# Patient Record
Sex: Female | Born: 1970 | Race: Black or African American | Hispanic: No | Smoking: Former smoker
Health system: Southern US, Community
[De-identification: ages and names within clinical notes are randomized; demographics above are authoritative.]

## PROBLEM LIST (undated history)

## (undated) DIAGNOSIS — A6000 Herpesviral infection of urogenital system, unspecified: Secondary | ICD-10-CM

## (undated) DIAGNOSIS — D219 Benign neoplasm of connective and other soft tissue, unspecified: Secondary | ICD-10-CM

## (undated) DIAGNOSIS — K219 Gastro-esophageal reflux disease without esophagitis: Secondary | ICD-10-CM

## (undated) DIAGNOSIS — E039 Hypothyroidism, unspecified: Secondary | ICD-10-CM

## (undated) DIAGNOSIS — D649 Anemia, unspecified: Secondary | ICD-10-CM

## (undated) DIAGNOSIS — N920 Excessive and frequent menstruation with regular cycle: Secondary | ICD-10-CM

## (undated) DIAGNOSIS — E669 Obesity, unspecified: Secondary | ICD-10-CM

## (undated) HISTORY — DX: Anemia, unspecified: D64.9

## (undated) HISTORY — DX: Obesity, unspecified: E66.9

## (undated) HISTORY — DX: Hypothyroidism, unspecified: E03.9

## (undated) HISTORY — DX: Benign neoplasm of connective and other soft tissue, unspecified: D21.9

## (undated) HISTORY — DX: Excessive and frequent menstruation with regular cycle: N92.0

## (undated) HISTORY — DX: Gastro-esophageal reflux disease without esophagitis: K21.9

## (undated) HISTORY — PX: ABDOMINAL HYSTERECTOMY: SHX81

## (undated) HISTORY — DX: Herpesviral infection of urogenital system, unspecified: A60.00

---

## 1994-10-25 HISTORY — PX: TUBAL LIGATION: SHX77

## 2007-08-14 ENCOUNTER — Emergency Department: Payer: Self-pay | Admitting: Emergency Medicine

## 2012-10-25 HISTORY — PX: BREAST BIOPSY: SHX20

## 2013-04-16 ENCOUNTER — Ambulatory Visit: Payer: Self-pay | Admitting: Obstetrics & Gynecology

## 2013-04-20 ENCOUNTER — Encounter: Payer: Self-pay | Admitting: *Deleted

## 2013-05-01 ENCOUNTER — Encounter: Payer: Self-pay | Admitting: General Surgery

## 2013-05-01 ENCOUNTER — Ambulatory Visit (INDEPENDENT_AMBULATORY_CARE_PROVIDER_SITE_OTHER): Payer: BC Managed Care – PPO | Admitting: General Surgery

## 2013-05-01 VITALS — BP 150/72 | HR 72 | Resp 14 | Ht 59.0 in | Wt 214.0 lb

## 2013-05-01 DIAGNOSIS — N63 Unspecified lump in unspecified breast: Secondary | ICD-10-CM

## 2013-05-01 NOTE — Patient Instructions (Addendum)
Continue self breast exams. Call office for any new breast issues or concerns.     CARE AFTER BREAST BIOPSY  1. Leave the dressing on that your doctor applied after surgery. It is waterproof. You may bathe, shower and/or swim. The dressing will probably remain intact until your return office visit. If the dressing comes off, you will see small strips of tape against your skin on the incision. Do not remove these strips.  2. You may want to use a gauze,cloth or similar protection in your bra to prevent rubbing against your dressing and incision. This is not necessary, but you may feel more comfortable doing so.  3. It is recommended that you wear a bra day and night to give support to the breast. This will prevent the weight of the breast from pulling on the incision.  4. Your breast will feel hard and lumpy under the incision. Do not be alarmed. This is the underlying stitching of tissue. Softening of this tissue will occur in time.  5. Make sure you call the office and schedule an appointment in one week after your surgery. The office phone number is [phone]. The nurses at [facility] may have already done this for you.  6. You will notice about a week after your office visit that the strips of the tape on your incision will begin to loosen. These may then be removed.  7. Report to your doctor any of the following:  * Severe pain not relieved by your pain medication  *Redness of the incision  * Drainage from the incision  *Fever greater than 101 degrees  If biopsy results normal then follow up in 6 months with left mammogram and office visit

## 2013-05-01 NOTE — Progress Notes (Signed)
aPatient ID: Sue Chan, female   DOB: [birth year], 42 y.o.   MRN: [id number]  Chief Complaint  Patient presents with  . Other    mammogram    HPI Sue Chan is a 42 y.o. female.  who presents for a breast evaluation. The most recent mammogram was done on 04-02-13. This was actually her first mammogram. Patient does perform regular self breast checks. Family history of breast cancer includes a paternal cousin no immediate family members. Denies any breast trauma and states that she "can't feel anything".   No breast issues only" little soreness" since mammogram.  HPI  Past Medical History  Diagnosis Date  . GERD (gastroesophageal reflux disease)     Past Surgical History  Procedure Laterality Date  . Tubal ligation  1996  . Cesarean section  1994    Family History  Problem Relation Age of Onset  . Breast cancer Cousin 51    paternal  . Colon polyps Mother 87    Social History History  Substance Use Topics  . Smoking status: Current Every Day Smoker -- 1.00 packs/day for 5 years  . Smokeless tobacco: Never Used     Comment: trying to cut down on the smoking  . Alcohol Use: Yes    No Known Allergies  Current Outpatient Prescriptions  Medication Sig Dispense Refill  . ibuprofen (ADVIL,MOTRIN) 100 MG tablet Take 100 mg by mouth every 6 (six) hours as needed for fever.      . Iron-Vit C-Vit B12-Folic Acid (IRON 100 PLUS PO) Take by mouth.      Marland Kitchen LORATADINE ALLERGY RELIEF PO Take 10 mg by mouth daily.      . metroNIDAZOLE (FLAGYL) 250 MG tablet Take 250 mg by mouth 3 (three) times daily.      . vitamin B-12 (CYANOCOBALAMIN) 100 MCG tablet Take 50 mcg by mouth daily.       No current facility-administered medications for this visit.    Review of Systems Review of Systems  Blood pressure 150/72, pulse 72, resp. rate 14, height 4\' 11"  (1.499 m), weight 214 lb (97.07 kg), last menstrual period 04/28/2013.  Physical Exam Physical Exam  Constitutional: She is  oriented to person, place, and time. She appears well-developed and well-nourished.  Cardiovascular: Normal rate and regular rhythm.   Pulmonary/Chest: Effort normal and breath sounds normal. Right breast exhibits no inverted nipple, no mass, no nipple discharge, no skin change and no tenderness. Left breast exhibits no inverted nipple, no mass, no nipple discharge, no skin change and no tenderness.  Lymphadenopathy:    She has no cervical adenopathy.    She has no axillary adenopathy.  Neurological: She is alert and oriented to person, place, and time.  Skin: Skin is warm and dry.   Left breast > right breast 1/2 cup size Data Reviewed The patient had previously undergone her first screening mammogram in June 2014 at [facility]. This showed an abnormality in the left breast for which additional views and ultrasound were completed at [facility]. On April 16, 2013. Total spot compression views showed a persistent macro lobulated mass in the left breast. Ultrasound in the 8:00 position 4 cm from the nipple she is 0.7 x 1.0 x 1.3 cm macro lobulated hypoechoic mass with peripheral Doppler flow.Indeterminate lesion. BI-RAD-4.   Ultrasound examination confirmed the [facility] findings. The options for management for this first-ever lesion were reviewed: 1) early vacuum-assisted biopsy versus 2) observation with 6 month followup ultrasound. The patient desired early biopsy. Assessment  Macrolobulated lesion of left breast.     Plan    Biopsy was completed using 10 cc of 0.5% Xylocaine with 0.25% Marcaine with 10-1998 units of epinephrine. This was well tolerated. The area was prepped with Betadine solution and draped. Using a 10-gauge Encor device the area was completely removed. A postbiopsy clip was placed. Scant bleeding was noted. The skin defect was closed with benzoin and Steri-Strips followed by Telfa and Tegaderm dressing. Written instructions were provided. The patient will follow up with the CMA.  In 6 days for wound evaluation. If pathology is benign as expected a repeat left mammogram with office visit follow be scheduled in 6 months. No contraindication to IUD placement for birth control.       Sue Chan 05/01/2013, 8:36 PM

## 2013-05-03 ENCOUNTER — Telehealth: Payer: Self-pay | Admitting: *Deleted

## 2013-05-03 LAB — PATHOLOGY

## 2013-05-03 NOTE — Telephone Encounter (Signed)
Notified patient as instructed, left breast biopsy benign, no cancer per Dr Lemar Livings, patient pleased. Discussed follow-up appointments, patient agrees

## 2013-05-07 ENCOUNTER — Ambulatory Visit (INDEPENDENT_AMBULATORY_CARE_PROVIDER_SITE_OTHER): Payer: BC Managed Care – PPO | Admitting: *Deleted

## 2013-05-07 DIAGNOSIS — N63 Unspecified lump in unspecified breast: Secondary | ICD-10-CM

## 2013-05-07 NOTE — Progress Notes (Signed)
Patient here today for follow up post left  breast biopsy. No bruising noted.  The patient is aware that a heating pad may be used for comfort as needed.  Aware of pathology. Follow up as scheduled. 

## 2013-05-18 ENCOUNTER — Encounter: Payer: Self-pay | Admitting: General Surgery

## 2013-10-25 DIAGNOSIS — A6000 Herpesviral infection of urogenital system, unspecified: Secondary | ICD-10-CM | POA: Insufficient documentation

## 2013-10-25 HISTORY — DX: Herpesviral infection of urogenital system, unspecified: A60.00

## 2013-10-25 HISTORY — PX: NOVASURE ABLATION: SHX5394

## 2013-11-23 ENCOUNTER — Ambulatory Visit: Payer: Self-pay | Admitting: General Surgery

## 2013-11-26 ENCOUNTER — Encounter: Payer: Self-pay | Admitting: General Surgery

## 2013-11-27 ENCOUNTER — Ambulatory Visit: Payer: BC Managed Care – PPO | Admitting: General Surgery

## 2013-11-27 DIAGNOSIS — N393 Stress incontinence (female) (male): Secondary | ICD-10-CM | POA: Insufficient documentation

## 2013-12-06 ENCOUNTER — Encounter: Payer: Self-pay | Admitting: *Deleted

## 2014-01-31 DIAGNOSIS — E0590 Thyrotoxicosis, unspecified without thyrotoxic crisis or storm: Secondary | ICD-10-CM | POA: Insufficient documentation

## 2014-05-17 ENCOUNTER — Ambulatory Visit: Payer: Self-pay | Admitting: Obstetrics and Gynecology

## 2014-05-17 ENCOUNTER — Encounter: Payer: Self-pay | Admitting: General Surgery

## 2014-08-14 DIAGNOSIS — J040 Acute laryngitis: Secondary | ICD-10-CM | POA: Insufficient documentation

## 2014-08-26 ENCOUNTER — Encounter: Payer: Self-pay | Admitting: General Surgery

## 2014-12-24 HISTORY — PX: THYROIDECTOMY: SHX17

## 2016-11-19 ENCOUNTER — Encounter: Payer: Self-pay | Admitting: Emergency Medicine

## 2016-11-19 ENCOUNTER — Ambulatory Visit
Admission: EM | Admit: 2016-11-19 | Discharge: 2016-11-19 | Disposition: A | Discharge status: Home / Self Care | Payer: BLUE CROSS/BLUE SHIELD | Attending: Family Medicine | Admitting: Family Medicine

## 2016-11-19 DIAGNOSIS — H6691 Otitis media, unspecified, right ear: Secondary | ICD-10-CM | POA: Diagnosis not present

## 2016-11-19 DIAGNOSIS — J40 Bronchitis, not specified as acute or chronic: Secondary | ICD-10-CM | POA: Diagnosis not present

## 2016-11-19 MED ORDER — BENZONATATE 100 MG PO CAPS: 100.0000 mg | ORAL_CAPSULE | Freq: Three times a day (TID) | ORAL | 0 refills | Status: DC | PRN

## 2016-11-19 MED ORDER — HYDROCOD POLST-CPM POLST ER 10-8 MG/5ML PO SUER: 5.0000 mL | Freq: Every evening | ORAL | 0 refills | Status: DC | PRN

## 2016-11-19 MED ORDER — AZITHROMYCIN 250 MG PO TABS: ORAL_TABLET | ORAL | 0 refills | Status: DC

## 2016-11-19 NOTE — ED Triage Notes (Signed)
Patient c/o cough and chest congestion that got worse on Monday.  Patient states that she previously had the flu. Patient denies fevers.

## 2016-11-19 NOTE — ED Provider Notes (Signed)
[name] ____________________________________________  Time seen: Approximately 1700 PM  I have reviewed the triage vital signs and the nursing notes.   HISTORY  Chief Complaint Cough   HPI Sue Chan is a 46 y.o. female presenting for the complaints of 1.5 weeks of cough, congestion. Patient reports symptoms initially started out with flulike symptoms. Patient reports that she was not seen last week for influenza, but reports her symptoms started with cough, congestion, chills, body aches and fever shortly after being exposed from her granddaughter he tested positive for influenza. Patient states flulike symptoms resolved but cough and nasal congestion has continued. Also reports gradual onset of right ear discomfort for the last few days. Patient denies any fever since last week. Reports cough is intermittently productive. Denies wheezing. Reports overall continues to eat and drink well. Patient reports symptoms have been unresolved over-the-counter cough and congestion medications. Reports has continued to work.  Denies chest pain, chest pain with deep breath, shortness of breath, abdominal pain, dysuria, extremity pain, extremity swelling or rash. Denies recent immobilization. Denies recent antibiotic use.   [place] #2: PCP Patient's last menstrual period was 11/12/2016 (approximate). Denies pregnancy   Past Medical History:  Diagnosis Date  . GERD (gastroesophageal reflux disease)     Patient Active Problem List   Diagnosis Date Noted  . Breast mass 05/01/2013    Past Surgical History:  Procedure Laterality Date  . CESAREAN SECTION  1994  . THYROIDECTOMY    . TUBAL LIGATION  1996     No current facility-administered medications for this encounter.   Current Outpatient Prescriptions:  .  levothyroxine (SYNTHROID, LEVOTHROID) 112 MCG tablet, Take 112 mcg by mouth daily before breakfast., Disp: , Rfl:  .  azithromycin  (ZITHROMAX Z-PAK) 250 MG tablet, Take 2 tablets (500 mg) on  Day 1,  followed by 1 tablet (250 mg) once daily on Days 2 through 5., Disp: 6 each, Rfl: 0 .  benzonatate (TESSALON PERLES) 100 MG capsule, Take 1 capsule (100 mg total) by mouth 3 (three) times daily as needed for cough., Disp: 15 capsule, Rfl: 0 .  chlorpheniramine-HYDROcodone (TUSSIONEX PENNKINETIC ER) 10-8 MG/5ML SUER, Take 5 mLs by mouth at bedtime as needed for cough. do not drive or operate machinery while taking as can cause drowsiness., Disp: 75 mL, Rfl: 0 .  ibuprofen (ADVIL,MOTRIN) 100 MG tablet, Take 100 mg by mouth every 6 (six) hours as needed for fever., Disp: , Rfl:  .  Iron-Vit C-Vit B12-Folic Acid (IRON 123XX123 PLUS PO), Take by mouth., Disp: , Rfl:  .  LORATADINE ALLERGY RELIEF PO, Take 10 mg by mouth daily., Disp: , Rfl:  .  vitamin B-12 (CYANOCOBALAMIN) 100 MCG tablet, Take 50 mcg by mouth daily., Disp: , Rfl:   Allergies Patient has no known allergies.  Family History  Problem Relation Age of Onset  . Breast cancer Cousin 60    paternal  . Colon polyps Mother 21    Social History Social History  Substance Use Topics  . Smoking status: Former Smoker    Packs/day: 1.00    Years: 5.00  . Smokeless tobacco: Never Used     Comment: trying to cut down on the smoking  . Alcohol use Yes    Review of Systems Constitutional:  As above. Eyes: No visual changes. ENT: No sore throat. Cardiovascular: Denies chest pain. Respiratory: Denies shortness of breath. Gastrointestinal: No abdominal pain.  No nausea, no vomiting.  No diarrhea.  No  constipation. Genitourinary: Negative for dysuria. Musculoskeletal: Negative for back pain. Skin: Negative for rash. Neurological: Negative for headaches, focal weakness or numbness.  10-point ROS otherwise negative.  ____________________________________________   PHYSICAL EXAM:  VITAL SIGNS: ED Triage Vitals  Enc Vitals Group     BP 11/19/16 1613 123/69     Pulse  Rate 11/19/16 1613 87     Resp 11/19/16 1613 16     Temp 11/19/16 1613 98.4 F (36.9 C)     Temp Source 11/19/16 1613 Oral     SpO2 11/19/16 1613 100 %     Weight 11/19/16 1612 208 lb (94.3 kg)     Height 11/19/16 1612 4\' 11"  (1.499 m)     Head Circumference --      Peak Flow --      Pain Score 11/19/16 1614 8     Pain Loc --      Pain Edu? --      Excl. in Early? --     Constitutional: Alert and oriented. Well appearing and in no acute distress. Eyes: Conjunctivae are normal. PERRL. EOMI. Head: Atraumatic. No sinus tenderness to palpation. No swelling. No erythema.  Ears: Left: Nontender, erythema, normal TM. Right: Nontender, moderate erythema and dullness at TM, TM appears intact. No surrounding tenderness, swelling or erythema bilaterally.  Nose:Nasal congestion with clear rhinorrhea  Mouth/Throat: Mucous membranes are moist. No pharyngeal erythema. No tonsillar swelling or exudate.  Neck: No stridor.  No cervical spine tenderness to palpation. Hematological/Lymphatic/Immunilogical: No cervical lymphadenopathy. Cardiovascular: Normal rate, regular rhythm. Grossly normal heart sounds.  Good peripheral circulation. Respiratory: Normal respiratory effort.  No retractions. No wheezes, rales or rhonchi. Good air movement. Dry intermittent cough in room. Speaks in complete sentences  Gastrointestinal: Soft and nontender. No CVA tenderness. Musculoskeletal: Ambulatory with steady gait. No cervical, thoracic or lumbar tenderness to palpation. Bilateral lower extremities nontender. Neurologic:  Normal speech and language. No gait instability. Skin:  Skin appears warm, dry and intact. No rash noted. Psychiatric: Mood and affect are normal. Speech and behavior are normal. ___________________________________________   LABS (all labs ordered are listed, but only abnormal results are displayed)  Labs Reviewed - No data to display   PROCEDURES Procedures   INITIAL IMPRESSION / ASSESSMENT  AND PLAN / ED COURSE  Pertinent labs & imaging results that were available during my care of the patient were reviewed by me and considered in my medical decision making (see chart for details).  Well-appearing patient. No acute distress. Patient recently per her report, with influenza. Patient reports current symptoms started with influenza and continued with cough and congestion. Suspect post influenza bronchitis. No focal area of consolidation auscultated. Discuss evaluation treatment options with patient. Will treat patient with oral azithromycin, when necessary Tussionex and when necessary Tessalon Perles. Encouraged rest, fluids and supportive care. Patient denies need for work note.Discussed indication, risks and benefits of medications with patient.  Discussed follow up with Primary care physician this week. Discussed follow up and return parameters including no resolution or any worsening concerns. Patient verbalized understanding and agreed to plan.   ____________________________________________   FINAL CLINICAL IMPRESSION(S) / ED DIAGNOSES  Final diagnoses:  Bronchitis  Right otitis media, unspecified otitis media type     Discharge Medication List as of 11/19/2016  5:07 PM    START taking these medications   Details  azithromycin (ZITHROMAX Z-PAK) 250 MG tablet Take 2 tablets (500 mg) on  Day 1,  followed by 1 tablet (250 mg) once  daily on Days 2 through 5., Normal    benzonatate (TESSALON PERLES) 100 MG capsule Take 1 capsule (100 mg total) by mouth 3 (three) times daily as needed for cough., Starting Fri 11/19/2016, Normal    chlorpheniramine-HYDROcodone (TUSSIONEX PENNKINETIC ER) 10-8 MG/5ML SUER Take 5 mLs by mouth at bedtime as needed for cough. do not drive or operate machinery while taking as can cause drowsiness., Starting Fri 11/19/2016, Print        Note: This dictation was prepared with Dragon dictation along with smaller phrase technology. Any transcriptional  errors that result from this process are unintentional.         Marylene Land, NP 11/19/16 [id number]

## 2016-11-19 NOTE — Discharge Instructions (Signed)
Take medication as prescribed. Rest. Drink plenty of fluids.  ° °Follow up with your primary care physician this week as needed. Return to Urgent care for new or worsening concerns.  ° °

## 2017-06-05 ENCOUNTER — Other Ambulatory Visit: Payer: Self-pay | Admitting: Certified Nurse Midwife

## 2017-07-25 ENCOUNTER — Other Ambulatory Visit: Payer: Self-pay | Admitting: Certified Nurse Midwife

## 2017-08-18 ENCOUNTER — Other Ambulatory Visit: Payer: Self-pay | Admitting: Obstetrics and Gynecology

## 2017-09-08 ENCOUNTER — Other Ambulatory Visit: Payer: Self-pay | Admitting: Certified Nurse Midwife

## 2017-12-12 ENCOUNTER — Telehealth: Payer: Self-pay

## 2017-12-12 MED ORDER — NORETHINDRONE 0.35 MG PO TABS: 1.0000 | ORAL_TABLET | Freq: Every day | ORAL | 0 refills | Status: DC

## 2017-12-12 NOTE — Telephone Encounter (Signed)
Pt calling for refill of bc, norlyda, annual sched 3/1st.  Left detailed msg that refilled eRx'd.

## 2017-12-23 ENCOUNTER — Ambulatory Visit: Payer: Self-pay | Admitting: Certified Nurse Midwife

## 2017-12-30 ENCOUNTER — Ambulatory Visit (INDEPENDENT_AMBULATORY_CARE_PROVIDER_SITE_OTHER): Payer: BLUE CROSS/BLUE SHIELD | Admitting: Certified Nurse Midwife

## 2017-12-30 ENCOUNTER — Encounter: Payer: Self-pay | Admitting: Certified Nurse Midwife

## 2017-12-30 VITALS — BP 114/82 | HR 89 | Ht 60.0 in | Wt 213.0 lb

## 2017-12-30 DIAGNOSIS — Z1231 Encounter for screening mammogram for malignant neoplasm of breast: Secondary | ICD-10-CM

## 2017-12-30 DIAGNOSIS — E669 Obesity, unspecified: Secondary | ICD-10-CM

## 2017-12-30 DIAGNOSIS — Z01419 Encounter for gynecological examination (general) (routine) without abnormal findings: Secondary | ICD-10-CM

## 2017-12-30 DIAGNOSIS — D219 Benign neoplasm of connective and other soft tissue, unspecified: Secondary | ICD-10-CM

## 2017-12-30 DIAGNOSIS — Z124 Encounter for screening for malignant neoplasm of cervix: Secondary | ICD-10-CM

## 2017-12-30 DIAGNOSIS — Z1239 Encounter for other screening for malignant neoplasm of breast: Secondary | ICD-10-CM

## 2017-12-30 HISTORY — DX: Obesity, unspecified: E66.9

## 2017-12-30 MED ORDER — NORETHIN-ETH ESTRAD-FE BIPHAS 1 MG-10 MCG / 10 MCG PO TABS: 1.0000 | ORAL_TABLET | Freq: Every day | ORAL | 0 refills | Status: DC

## 2017-12-30 NOTE — Progress Notes (Signed)
Gynecology Annual Exam  PCP: #2, [facility]  Chief Complaint:  Chief Complaint  Patient presents with  . Gynecologic Exam    Talk about changing birth control    History of Present Illness:Sue Chan presents today for her annual exam. She is a year old African American/Black female, G 2 P 2 0 0 2, whose LMP was 12/23/2017. Her menses are a little  Irregular on the progesterone only pills. They occur every month, they last 5 days, area moderate flow. She has not had any intermenstrual spotting. She is cramping with her menses, but it has gotten less since starting the progesterone only pill. She occasionally uses ibuprofen 800 mgm  For pain with relief. She has a history of menorrhagia and fibroids. She tried a Corporate treasurer then had a ablation when the Mirena failed. Pt reports her cycles have become lighter since her NovaSure procedure in 02/2014. OCPs then progesterone only pills were then added to help to help regulate her menstrual cycle. She is asking about switching back to combined oral contraceptive pills to make her menses more regular and to maybe take them to eliminate some cycles.  Since her last annual exam 10/16/2017she has gained 14#. She has restarted phenteramine and exercise to try to lose weight. 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  The patient's past medical history is remarkable for hypothyroidism following a thyroidectomy in March 2016 for multinodular goiter, and obesity. Pt sporadically takes diuretics for swelling and denies any history of hypertension. Is no longer taking metformin for weight loss. She is sexually active. She is currently using permanent sterilization for contraception. She has a history of HSV 2 antibodies but denies any history of an outbreak.  Her most recent pap smear was obtained 08/09/2016 and was NIL. Her most recent mammogram obtained 04/2014 and after additional views and an ultrasound  for focal asymmetry, no masses were seen and it was Birads 3. There is a remote family history of breast cancer in her paternal second cousin. Genetic testing has not been done. There is no family history of ovarian cancer. The patient sometimes does not do monthly self breast exams.  The patient does not smoke.  The patient does not drink alcohol.  The patient does not use illegal drugs.  The patient exercises regularly. Her current BMI is 41.60kg/m2  The patient does get adequate calcium in her diet.  She has her labs managed by her PCP- [facility] in [place]. 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     Review of Systems: Review of Systems  Constitutional: Positive for malaise/fatigue. Negative for chills, fever and weight loss.       Positive for weight gain  HENT: Negative for congestion, sinus pain and sore throat.   Eyes: Positive for redness. Negative for blurred vision and pain.  Respiratory: Negative for hemoptysis, shortness of breath and wheezing.   Cardiovascular: Negative for chest pain, palpitations and leg swelling.  Gastrointestinal: Negative for abdominal pain, blood in stool, diarrhea, heartburn, nausea and vomiting.  Genitourinary: Negative for dysuria, frequency, hematuria and urgency.  Musculoskeletal: Negative for back pain, joint pain and myalgias.  Skin: Negative for itching and rash.  Neurological: Negative for dizziness, tingling and headaches.  Endo/Heme/Allergies: Positive for environmental allergies (with sneezing and coughing). Negative for polydipsia. Does not bruise/bleed easily.       Positive for hirsutism   Psychiatric/Behavioral: Negative for depression. The patient  is not nervous/anxious and does not have insomnia.        Positive for stress    Past Medical History:  Past Medical History:  Diagnosis Date  . Anemia   . Fibroids   . GERD (gastroesophageal reflux disease)   . 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  Hypothyroidism   . Menorrhagia   . Obesity 12/30/2017   BMI41.60 kg/m2    Past Surgical History:  Past Surgical History:  Procedure Laterality Date  . CESAREAN SECTION  1994  . NOVASURE ABLATION  2015   performed in office  . THYROIDECTOMY  12/2014   multinodular goiter  . TUBAL LIGATION  1996    Family History:  Family History  Problem Relation Age of Onset  . Breast cancer Cousin 68       paternal second cousin  . Colon polyps Mother 16  . Hypertension Mother   . Thyroid disease Sister     Social History:  Social History   Socioeconomic History  . Marital status: Legally Separated    Spouse name: Not on file  . Number of children: 2  . Years of education: Not on file  . Highest education level: Not on file  Social Needs  . Financial resource strain: Not on file  . Food insecurity - worry: Not on file  . Food insecurity - inability: Not on file  . Transportation needs - medical: Not on file  . Transportation needs - non-medical: Not on file  Occupational History  . Occupation: Recruitment consultant  Tobacco Use  . Smoking status: Former Smoker    Packs/day: 1.00    Years: 5.00    Pack years: 5.00  . Smokeless tobacco: Never Used  Substance and Sexual Activity  . Alcohol use: Yes    Comment: occsional  . Drug use: No  . Sexual activity: Yes    Partners: Male    Birth control/protection: Pill  Other Topics Concern  . Not on file  Social History Narrative  . Not on file    Allergies:  No Known Allergies  Medications:  Current Outpatient Medications on File Prior to Visit  Medication Sig Dispense Refill  . fluconazole (DIFLUCAN) 150 MG tablet   2  . ibuprofen (ADVIL,MOTRIN) 100 MG tablet Take 100 mg by mouth every 6 (six) hours as needed for fever.    . levothyroxine (SYNTHROID, LEVOTHROID) 112 MCG tablet Take 112 mcg by mouth daily before breakfast.    . norethindrone (NORLYDA) 0.35 MG tablet Take 1 tablet (0.35 mg total) by mouth daily. 28  tablet 0  . azithromycin (ZITHROMAX Z-PAK) 250 MG tablet Take 2 tablets (500 mg) on  Day 1,  followed by 1 tablet (250 mg) once daily on Days 2 through 5. (Patient not taking: Reported on 12/30/2017) 6 each 0  . benzonatate (TESSALON PERLES) 100 MG capsule Take 1 capsule (100 mg total) by mouth 3 (three) times daily as needed for cough. (Patient not taking: Reported on 12/30/2017) 15 capsule 0  . chlorpheniramine-HYDROcodone (TUSSIONEX PENNKINETIC ER) 10-8 MG/5ML SUER Take 5 mLs by mouth at bedtime as needed for cough. do not drive or operate machinery while taking as can cause drowsiness. (Patient not taking: Reported on 12/30/2017) 75 mL 0  . Iron-Vit C-Vit B12-Folic Acid (IRON 616 PLUS PO) Take by mouth.    Marland Kitchen LORATADINE ALLERGY RELIEF PO Take 10 mg by mouth daily.    . vitamin B-12 (CYANOCOBALAMIN) 100 MCG  tablet Take 50 mcg by mouth daily.     No current facility-administered medications on file prior to visit.    Physical Exam Vitals: BP 114/82   Pulse 89   Ht 5' (1.524 m)   Wt 213 lb (96.6 kg)   LMP 12/23/2017 (Exact Date)   BMI 41.60 kg/m   General: AAF in  NAD HEENT: normocephalic, anicteric Neck:  no palpable nodules, no cervical lymphadenopathy  Pulmonary: No increased work of breathing, CTAB Cardiovascular: RRR, without murmur  Breast: Breast symmetrical, no tenderness, no palpable nodules or masses, no skin or nipple retraction present, no nipple discharge.  No axillary, infraclavicular or supraclavicular lymphadenopathy. Abdomen: Soft, non-tender, obese, non-distended.  Umbilicus without lesions.  No hepatomegaly or masses palpable. No evidence of hernia. Genitourinary:  External: Normal external female genitalia.  Normal urethral meatus, normal Bartholin's and Skene's glands.    Vagina: Normal vaginal mucosa, no evidence of prolapse.    Cervix: Very posterior, no bleeding, non-tender  Uterus: Anteflexed, 10 week size, irregular contour, mobile, and non-tender  Adnexa: No  adnexal masses, non-tender  Rectal: deferred  Lymphatic: no evidence of inguinal lymphadenopathy Extremities: no edema, erythema, or tenderness Neurologic: Grossly intact Psychiatric: mood appropriate, affect full     Assessment: 47 y.o. G2P2 annual gyn exam Fibroid uterus-size similar to last exam Hx of menorrhagia  Plan:   1) Breast cancer screening - recommend monthly self breast exam and annual screening mammograms. Mammogram was ordered today. Patient to schedule at [facility].  2) Discussed options of switching over to a OCP or an extended cycle pill vs continuing on the progesterone only pill. After further discussion, patient realized that she has been taking only 3 of the 4 weeks of pills in each pack, thinking that the last week was placebos. Explained the correct way to take the pills. Mutual decision to have patient try the Lo Loestrin. There are no contraindications to estrogen. Explained how to take these pills and also explained risks of thromboembolism as well as other more common side effects on the pill. Follow up in 2-3 months for blood pressure check.  3) Cervical cancer screening - Pap was done. ASCCP guidelines and rational discussed.    4) discussed calcium and vitamin D3 requirements and the benefits of exercise.  5) Routine healthcare maintenance including cholesterol and diabetes screening managed by PCP   Dalia Heading, CNM

## 2017-12-31 ENCOUNTER — Encounter: Payer: Self-pay | Admitting: Certified Nurse Midwife

## 2017-12-31 DIAGNOSIS — D219 Benign neoplasm of connective and other soft tissue, unspecified: Secondary | ICD-10-CM | POA: Insufficient documentation

## 2017-12-31 DIAGNOSIS — E039 Hypothyroidism, unspecified: Secondary | ICD-10-CM | POA: Insufficient documentation

## 2017-12-31 DIAGNOSIS — N920 Excessive and frequent menstruation with regular cycle: Secondary | ICD-10-CM | POA: Insufficient documentation

## 2017-12-31 DIAGNOSIS — D649 Anemia, unspecified: Secondary | ICD-10-CM | POA: Insufficient documentation

## 2017-12-31 DIAGNOSIS — K219 Gastro-esophageal reflux disease without esophagitis: Secondary | ICD-10-CM | POA: Insufficient documentation

## 2018-01-05 LAB — IGP, APTIMA HPV
HPV APTIMA: NEGATIVE
PAP Smear Comment: 0

## 2018-01-06 ENCOUNTER — Other Ambulatory Visit: Payer: Self-pay | Admitting: Certified Nurse Midwife

## 2018-01-06 ENCOUNTER — Telehealth: Payer: Self-pay

## 2018-01-06 MED ORDER — NORETHINDRONE 0.35 MG PO TABS: 1.0000 | ORAL_TABLET | Freq: Every day | ORAL | 5 refills | Status: DC

## 2018-01-06 NOTE — Telephone Encounter (Signed)
Please advise for new rx. I received prior authorization request yesterday but it appears that pt is requesting a generic alternative. Thank you!

## 2018-01-06 NOTE — Telephone Encounter (Signed)
Pt was rx'd Lo Loestrin FE, ins is asking for it to be approved and then it's going to cost more than she anticipated even c coupon.  Would like to forget about Lo Loestrin FE and have Norlitta called in instead b/c she was taking it wrong.  She will call us if she has any problems c the norlitta.  She uses [facility] in [place]. [phone]

## 2018-02-02 ENCOUNTER — Other Ambulatory Visit: Payer: Self-pay | Admitting: Certified Nurse Midwife

## 2018-02-02 DIAGNOSIS — R928 Other abnormal and inconclusive findings on diagnostic imaging of breast: Secondary | ICD-10-CM

## 2018-02-08 ENCOUNTER — Other Ambulatory Visit: Payer: Self-pay | Admitting: Certified Nurse Midwife

## 2018-02-08 ENCOUNTER — Telehealth: Payer: Self-pay

## 2018-02-08 DIAGNOSIS — R928 Other abnormal and inconclusive findings on diagnostic imaging of breast: Secondary | ICD-10-CM

## 2018-02-08 NOTE — Telephone Encounter (Signed)
Pt is very upset that it's taking so long to get an order for a mammogram sent to [facility].  She states it's been a month now and it took two weeks for the records to get there.  She wants a regular mammogram with a special view of the left.  "PLEASE have doctor sign off on the order"  States Norville has sent emails.  Would rather we call Norville instead of calling her.  Her # [phone]

## 2018-02-08 NOTE — Telephone Encounter (Signed)
Confirmed with Norville that they have the orders for bilateral diagnostic mammogram and bilateral ultrasound. Per [place] Poli they spoke to patient on 02/02/18 who said she would call back on Friday to schedule her mammogram. CLG placed these orders on 02/02/18. Left msg for pt with this information and to call if any questions. Pt can call norville directly to schedule.

## 2018-02-09 NOTE — Telephone Encounter (Signed)
Pt called again yesterday stating orders were not signed. CLG spoke to [place] yesterday to complete orders. Norville to contact pt to schedule appt.

## 2018-03-01 ENCOUNTER — Ambulatory Visit
Admission: EM | Admit: 2018-03-01 | Discharge: 2018-03-01 | Disposition: A | Discharge status: Home / Self Care | Payer: BLUE CROSS/BLUE SHIELD | Attending: Family Medicine | Admitting: Family Medicine

## 2018-03-01 DIAGNOSIS — B9789 Other viral agents as the cause of diseases classified elsewhere: Secondary | ICD-10-CM | POA: Diagnosis not present

## 2018-03-01 DIAGNOSIS — J988 Other specified respiratory disorders: Secondary | ICD-10-CM | POA: Diagnosis not present

## 2018-03-01 DIAGNOSIS — R509 Fever, unspecified: Secondary | ICD-10-CM

## 2018-03-01 DIAGNOSIS — J029 Acute pharyngitis, unspecified: Secondary | ICD-10-CM

## 2018-03-01 LAB — RAPID STREP SCREEN (MED CTR MEBANE ONLY): STREPTOCOCCUS, GROUP A SCREEN (DIRECT): NEGATIVE

## 2018-03-01 MED ORDER — AMOXICILLIN-POT CLAVULANATE 875-125 MG PO TABS: 1.0000 | ORAL_TABLET | Freq: Two times a day (BID) | ORAL | 0 refills | Status: DC

## 2018-03-01 NOTE — ED Triage Notes (Signed)
Pt said she has had a sore throat for a while and thought it was allergies until her fever started last night. Took Advil and Flonase. Runny nose and mucus.

## 2018-03-01 NOTE — ED Provider Notes (Signed)
[name]    CSN: [id number] Arrival date & time: 03/01/18  1717  History   Chief Complaint Chief Complaint  Patient presents with  . Sore Throat   HPI  47 year old female presents with respiratory symptoms.  Patient states that she has not been feeling well since the weekend.  She is been expensing severe allergies.  She is been taking over-the-counter allergy medication without tremendous improvement.  She states that over the weekend she developed sore throat and fever.  She has not had a true fever.  Her temperature was elevated in the 99's.  She reports associated congestion, cough and runny nose.  She continues to take over-the-counter allergy medication.  No reported sick contact.  No known exacerbating factors other than allergens.  No other associated symptoms.  No other complaints.  Past Medical History:  Diagnosis Date  . Anemia   . Fibroids   . GERD (gastroesophageal reflux disease)   . 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 Hypothyroidism    s/p thyroidectomy for multinodular goiter  . Menorrhagia   . Obesity 12/30/2017   BMI41.60 kg/m2    Patient Active Problem List   Diagnosis Date Noted  . Hypothyroidism   . Menorrhagia   . GERD (gastroesophageal reflux disease)   . Fibroids   . Anemia   . Obesity 12/30/2017  . Herpes genitalis 10/25/2013  . Breast mass 05/01/2013    Past Surgical History:  Procedure Laterality Date  . CESAREAN SECTION  1994  . NOVASURE ABLATION  2015   performed in office  . THYROIDECTOMY  12/2014   multinodular goiter  . TUBAL LIGATION  1996    OB History    Gravida  2   Para  2   Term  2   Preterm      AB      Living  2     SAB      TAB      Ectopic      Multiple      Live Births  2        Obstetric Comments  1st Menstrual Cycle:  13 1st Pregnancy:  20         Home Medications    Prior to Admission medications   Medication Sig Start Date End Date Taking? Authorizing  Provider  hydrochlorothiazide (MICROZIDE) 12.5 MG capsule Take 10 mg by mouth daily.   Yes [provider]  levothyroxine (SYNTHROID, LEVOTHROID) 112 MCG tablet Take 112 mcg by mouth daily before breakfast.   Yes [provider]  LORATADINE ALLERGY RELIEF PO Take 10 mg by mouth daily.   Yes [provider]  norethindrone (NORLYDA) 0.35 MG tablet Take 1 tablet (0.35 mg total) by mouth daily. 01/06/18  Yes Dalia Heading, CNM  vitamin B-12 (CYANOCOBALAMIN) 100 MCG tablet Take 50 mcg by mouth daily.   Yes [provider]  amoxicillin-clavulanate (AUGMENTIN) 875-125 MG tablet Take 1 tablet by mouth every 12 (twelve) hours. 03/01/18   Coral Spikes, DO  fluconazole (DIFLUCAN) 150 MG tablet  12/29/17   [provider]  ibuprofen (ADVIL,MOTRIN) 100 MG tablet Take 100 mg by mouth every 6 (six) hours as needed for fever.    [provider]  Iron-Vit C-Vit B12-Folic Acid (IRON 128 PLUS PO) Take by mouth.    [provider]    Family History Family History  Problem Relation Age of Onset  . Breast cancer Cousin 45  paternal second cousin  . Colon polyps Mother 18  . Hypertension Mother   . Thyroid disease Sister     Social History Social History   Tobacco Use  . Smoking status: Former Smoker    Packs/day: 1.00    Years: 5.00    Pack years: 5.00  . Smokeless tobacco: Never Used  Substance Use Topics  . Alcohol use: Yes    Comment: occsional  . Drug use: No     Allergies   Patient has no known allergies.   Review of Systems Review of Systems  Constitutional: Positive for fever.  HENT: Positive for congestion, rhinorrhea and sore throat.   Respiratory: Positive for cough.    Physical Exam Triage Vital Signs ED Triage Vitals  Enc Vitals Group     BP 03/01/18 1730 (!) 140/94     Pulse Rate 03/01/18 1730 89     Resp 03/01/18 1730 18     Temp 03/01/18 1730 98 F (36.7 C)     Temp Source 03/01/18 1730 Oral      SpO2 03/01/18 1730 100 %     Weight 03/01/18 1732 220 lb (99.8 kg)     Height --      Head Circumference --      Peak Flow --      Pain Score 03/01/18 1732 7     Pain Loc --      Pain Edu? --      Excl. in [place]? --    Updated Vital Signs BP (!) 140/94 (BP Location: Left Arm)   Pulse 89   Temp 98 F (36.7 C) (Oral)   Resp 18   Wt 220 lb (99.8 kg)   LMP 02/14/2018 (Exact Date)   SpO2 100%   BMI 42.97 kg/m   Physical Exam  Constitutional: She is oriented to person, place, and time. She appears well-developed and well-nourished. No distress.  HENT:  Head: Normocephalic and atraumatic.  Nose: Mucosal edema present.  Mouth/Throat: Oropharynx is clear and moist.  Eyes: Conjunctivae are normal.  Cardiovascular: Normal rate and regular rhythm.  Pulmonary/Chest: Effort normal and breath sounds normal.  Neurological: She is alert and oriented to person, place, and time.  Psychiatric: She has a normal mood and affect. Her behavior is normal.  Nursing note and vitals reviewed.  UC Treatments / Results  Labs (all labs ordered are listed, but only abnormal results are displayed) Labs Reviewed  RAPID STREP SCREEN ([facility] ONLY)  CULTURE, GROUP A STREP [facility])    EKG None  Radiology No results found.  Procedures Procedures (including critical care time)  Medications Ordered in UC Medications - No data to display  Initial Impression / Assessment and Plan / UC Course  I have reviewed the triage vital signs and the nursing notes.  Pertinent labs & imaging results that were available during my care of the patient were reviewed by me and considered in my medical decision making (see chart for details).    47 year old female presents with a respiratory illness.  Likely viral on top of ongoing allergies.  Wait-and-see prescription for Augmentin given.  Continue Flonase and over-the-counter antihistamines.  Final Clinical Impressions(s) / UC Diagnoses   Final diagnoses:  Viral  respiratory illness     Discharge Instructions     This is likely viral + allergies.  Continue doing what you're doing.  Antibiotic if you fail to improve or worsen.  Take care  Dr. Lacinda Axon   ED Prescriptions  Medication Sig Dispense Auth. Provider   amoxicillin-clavulanate (AUGMENTIN) 875-125 MG tablet Take 1 tablet by mouth every 12 (twelve) hours. 14 tablet Coral Spikes, DO     Controlled Substance Prescriptions [place] Controlled Substance Registry consulted? Not Applicable   Coral Spikes, DO 03/01/18 [id number]

## 2018-03-01 NOTE — Discharge Instructions (Signed)
This is likely viral + allergies.  Continue doing what you're doing.  Antibiotic if you fail to improve or worsen.  Take care  Dr. Lacinda Axon

## 2018-03-03 ENCOUNTER — Ambulatory Visit
Admission: RE | Admit: 2018-03-03 | Discharge: 2018-03-03 | Disposition: A | Discharge status: Home / Self Care | Payer: BLUE CROSS/BLUE SHIELD | Source: Ambulatory Visit | Attending: Certified Nurse Midwife | Admitting: Certified Nurse Midwife

## 2018-03-03 DIAGNOSIS — R928 Other abnormal and inconclusive findings on diagnostic imaging of breast: Secondary | ICD-10-CM

## 2018-03-04 LAB — CULTURE, GROUP A STREP (THRC)

## 2018-03-08 ENCOUNTER — Encounter: Payer: Self-pay | Admitting: Certified Nurse Midwife

## 2018-03-08 DIAGNOSIS — N6012 Diffuse cystic mastopathy of left breast: Secondary | ICD-10-CM

## 2018-03-08 DIAGNOSIS — N6011 Diffuse cystic mastopathy of right breast: Secondary | ICD-10-CM | POA: Insufficient documentation

## 2018-04-07 ENCOUNTER — Ambulatory Visit: Payer: BLUE CROSS/BLUE SHIELD | Admitting: Certified Nurse Midwife

## 2018-07-14 ENCOUNTER — Other Ambulatory Visit: Payer: Self-pay | Admitting: Certified Nurse Midwife

## 2018-07-19 ENCOUNTER — Other Ambulatory Visit: Payer: Self-pay | Admitting: Certified Nurse Midwife

## 2018-07-19 MED ORDER — NORETHINDRONE 0.35 MG PO TABS: 1.0000 | ORAL_TABLET | Freq: Every day | ORAL | 6 refills | Status: DC

## 2018-07-19 NOTE — Telephone Encounter (Signed)
Pt inquiring about denial of refill of OCP. There was an issue at one time about changing her OCP bc her insurance said it was going to be expensive & only a few months was sent in, but she decided to keep it the same. She didn't think she needed a f/u apt. EJ#[phone].

## 2018-07-19 NOTE — Telephone Encounter (Signed)
She does not need a follow up appointment since she stayed on the progesterone only pills. Refills sent to pharmacy to last until next annual appointment in March

## 2018-07-20 NOTE — Telephone Encounter (Signed)
Left detailed msg.

## 2019-01-12 ENCOUNTER — Other Ambulatory Visit: Payer: Self-pay | Admitting: Certified Nurse Midwife

## 2019-01-31 ENCOUNTER — Telehealth: Payer: Self-pay

## 2019-01-31 NOTE — Telephone Encounter (Signed)
Pt called triage today requesting a different OCP. Please advise

## 2019-02-01 NOTE — Telephone Encounter (Signed)
Please schedule patient for a telephone appointment with me. [employer]

## 2019-02-05 NOTE — Telephone Encounter (Signed)
Called patient to schedule telephone visit, she preferred not to schedule appt, she says she knows what she wants, oral bc, and would like to just have it sent in.  Pt did not specify name, just said bc pill.

## 2019-02-06 ENCOUNTER — Telehealth: Payer: Self-pay | Admitting: Certified Nurse Midwife

## 2019-02-06 MED ORDER — NORETHIN ACE-ETH ESTRAD-FE 1-20 MG-MCG(24) PO TABS: 1.0000 | ORAL_TABLET | Freq: Every day | ORAL | 5 refills | Status: DC

## 2019-02-06 NOTE — Telephone Encounter (Signed)
Patient called. See telephone message. Will switch to Loestrin 24 generic. Having irregular bleeding on the POP. No contraindications to OCPs.

## 2019-02-06 NOTE — Telephone Encounter (Signed)
Sue Chan had called and wanted to switch from a progesterone only pill to a OCP. Tried switching to Lo Loestrin last year because having irregular bleeding on the POP and it worked well, but was too expensive, so she resumed the progesterone only pill. Can try her on Loestrin 24 generic. Discussed possible side effects (breast tenderness, nausea) and risks of thromboembolism. Nonsmoker and no history of CHTN, DM, cancer. Reviewed how to take pills and encouraged to take BP once on them 2 months. FU at time of annual. Dalia Heading, CNM

## 2019-03-01 ENCOUNTER — Telehealth: Payer: Self-pay

## 2019-03-01 NOTE — Telephone Encounter (Signed)
Pt called triage saying she has been on her period since April 18th, passing clots. Please advise.

## 2019-03-02 NOTE — Telephone Encounter (Signed)
Spoke with patient who is completing her first pack of Junel 24. Has been spotting and bleeding intermittently and cramping and passing 50cent size clots. Was switched from POP due to irregular bleeding.  Hx of fibroids. Discussed testing to differentiate hormonal cause of bleeding from pathological causes of bleeding. Advised to stop pills and will see her in 2 weeks for ultrasound and possible endometrial biopsy/ follow up.

## 2019-04-10 ENCOUNTER — Other Ambulatory Visit: Payer: Self-pay | Admitting: Certified Nurse Midwife

## 2019-04-10 DIAGNOSIS — D219 Benign neoplasm of connective and other soft tissue, unspecified: Secondary | ICD-10-CM

## 2019-04-10 DIAGNOSIS — N939 Abnormal uterine and vaginal bleeding, unspecified: Secondary | ICD-10-CM

## 2019-04-11 ENCOUNTER — Ambulatory Visit: Payer: BLUE CROSS/BLUE SHIELD | Admitting: Certified Nurse Midwife

## 2019-04-11 ENCOUNTER — Ambulatory Visit (INDEPENDENT_AMBULATORY_CARE_PROVIDER_SITE_OTHER): Payer: BC Managed Care – PPO

## 2019-04-11 ENCOUNTER — Other Ambulatory Visit (HOSPITAL_COMMUNITY)
Admission: RE | Admit: 2019-04-11 | Discharge: 2019-04-11 | Disposition: A | Discharge status: Home / Self Care | Payer: BC Managed Care – PPO | Source: Ambulatory Visit | Attending: Certified Nurse Midwife | Admitting: Certified Nurse Midwife

## 2019-04-11 ENCOUNTER — Encounter: Payer: Self-pay | Admitting: Certified Nurse Midwife

## 2019-04-11 ENCOUNTER — Other Ambulatory Visit: Payer: Self-pay

## 2019-04-11 VITALS — BP 124/76 | HR 85 | Ht 60.0 in | Wt 235.0 lb

## 2019-04-11 DIAGNOSIS — N939 Abnormal uterine and vaginal bleeding, unspecified: Secondary | ICD-10-CM | POA: Diagnosis not present

## 2019-04-11 DIAGNOSIS — Z23 Encounter for immunization: Secondary | ICD-10-CM

## 2019-04-11 DIAGNOSIS — D252 Subserosal leiomyoma of uterus: Secondary | ICD-10-CM | POA: Diagnosis not present

## 2019-04-11 DIAGNOSIS — D250 Submucous leiomyoma of uterus: Secondary | ICD-10-CM | POA: Diagnosis not present

## 2019-04-11 DIAGNOSIS — D251 Intramural leiomyoma of uterus: Secondary | ICD-10-CM

## 2019-04-11 DIAGNOSIS — D219 Benign neoplasm of connective and other soft tissue, unspecified: Secondary | ICD-10-CM

## 2019-04-11 NOTE — Patient Instructions (Signed)
Hysterectomy Information    A hysterectomy is a surgery to remove your uterus. After surgery, you will no longer have periods. Also, you will no longer be able to get pregnant.  Reasons for this surgery  You may have this surgery if:  · You have bleeding in your vagina:  ? That is not normal.  ? That does not stop, or that keeps coming back.  · You have long-term (chronic) pain in your lower belly (pelvic area).  · The lining of your uterus grows outside of the uterus (endometriosis).  · The lining of your uterus grows in the muscle of the uterus (adenomyosis).  · Your uterus falls down into your vagina (prolapse).  · You have a growth in your uterus that causes problems (uterine fibroids).  · You have cells that could turn into cancer (precancerous cells).  · You have cancer of the uterus or cervix.  Types of hysterectomies  There are 3 types of hysterectomies. Depending on the type, the surgery will:  · Remove the top part of the uterus (supracervical).  · Remove the uterus and the cervix (total).  · Remove the uterus, cervix, and tissue that holds the uterus in place (radical).  Ways a hysterectomy can be done  This surgery may be done in one of these ways:  · A cut (incision) is made in the belly (abdomen). The uterus is taken out through the cut.  · A cut is made in the vagina. The uterus is taken out through the cut.  · Three or four cuts are made in the belly. A device with a camera is put through one of the cuts. The uterus is cut into pieces and taken out through the cuts or the vagina.  · Three or four cuts are made in the belly. A device with a camera is put through one of the cuts. The uterus is taken out through the vagina.  · Three or four cuts are made in the belly. A computer helps control the surgical tools. The uterus is cut into small pieces. The pieces are taken out through the cuts or through the vagina.  Talk with your doctor about which way is best for you.  Risks of hysterectomy  Generally,  this surgery is safe. However, problems can happen, including:  · Bleeding.  · Needing donated blood (transfusion).  · Blood clots.  · Infection.  · Damage to other structures or organs.  · Allergic reactions.  · Needing to switch to a different type of surgery.  What to expect after surgery  · You will be given pain medicine.  · You will need to stay in the hospital for 1-2 days.  · Follow your doctor's instructions about:  ? Exercising.  ? Driving.  ? What activities are safe for you.  · You will need to have someone with you at home for 3-5 days.  · You will need to see your doctor after 2-4 weeks.  · You may get hot flashes, have night sweats, and have trouble sleeping.  · You may need to have Pap tests if your surgery was related to cancer. Talk with your doctor about how often you need Pap tests.  Questions to ask your doctor  · Do I need this surgery? Do I have other treatment options?  · What are my options for this surgery?  · What needs to be removed?  · What are the risks?  · What are the benefits?  ·   How long will I need to stay in the hospital?  · How long will I need to recover?  · What symptoms can I expect after the procedure?  Summary  · A hysterectomy is a surgery to remove your uterus. After surgery, you will no longer have periods. Also, you will no longer be able to get pregnant.  · Talk with your doctor about which type of hysterectomy is best for you.  This information is not intended to replace advice given to you by your health care provider. Make sure you discuss any questions you have with your health care provider.  Document Released: 01/03/2012 Document Revised: 01/11/2017 Document Reviewed: 01/11/2017  Elsevier Interactive Patient Education © 2019 Elsevier Inc.    Uterine Fibroids    Uterine fibroids (leiomyomas) are noncancerous (benign) tumors that can develop in the uterus. Fibroids may also develop in the fallopian tubes, cervix, or tissues (ligaments) near the uterus.  You may have  one or many fibroids. Fibroids vary in size, weight, and where they grow in the uterus. Some can become quite large. Most fibroids do not require medical treatment.  What are the causes?  The cause of this condition is not known.  What increases the risk?  You are more likely to develop this condition if you:  · Are in your 30s or 40s and have not gone through menopause.  · Have a family history of this condition.  · Are of African-American descent.  · Had your first period at an early age (early menarche).  · Have not had any children (nulliparity).  · Are overweight or obese.  What are the signs or symptoms?  Many women do not have any symptoms. Symptoms of this condition may include:  · Heavy menstrual bleeding.  · Bleeding or spotting between periods.  · Pain and pressure in the pelvic area, between the hips.  · Bladder problems, such as needing to urinate urgently or more often than usual.  · Inability to have children (infertility).  · Failure to carry pregnancy to term (miscarriage).  How is this diagnosed?  This condition may be diagnosed based on:  · Your symptoms and medical history.  · A physical exam.  · A pelvic exam that includes feeling for any tumors.  · Imaging tests, such as ultrasound or MRI.  How is this treated?  Treatment for this condition may include:  · Seeing your health care provider for follow-up visits to monitor your fibroids for any changes.  · Taking NSAIDs such as ibuprofen, naproxen, or aspirin to reduce pain.  · Hormone medicines. These may be taken as a pill, given in an injection, or delivered by a T-shaped device that is inserted into the uterus (intrauterine device, IUD).  · Surgery to remove one of the following:  ? The fibroids (myomectomy). Your health care provider may recommend this if fibroids affect your fertility and you want to become pregnant.  ? The uterus (hysterectomy).  ? Blood supply to the fibroids (uterine artery embolization).  Follow these instructions at  home:  · Take over-the-counter and prescription medicines only as told by your health care provider.  · Ask your health care provider if you should take iron pills or eat more iron-rich foods, such as dark green, leafy vegetables. Heavy menstrual bleeding can cause low iron levels.  · If directed, apply heat to your back or abdomen to reduce pain. Use the heat source that your health care provider recommends, such as a moist   heat pack or a heating pad.  ? Place a towel between your skin and the heat source.  ? Leave the heat on for 20-30 minutes.  ? Remove the heat if your skin turns bright red. This is especially important if you are unable to feel pain, heat, or cold. You may have a greater risk of getting burned.  · Pay close attention to your menstrual cycle. Tell your health care provider about any changes, such as:  ? Increased blood flow that requires you to use more pads or tampons than usual.  ? A change in the number of days that your period lasts.  ? A change in symptoms that are associated with your period, such as back pain or cramps in your abdomen.  · Keep all follow-up visits as told by your health care provider. This is important, especially if your fibroids need to be monitored for any changes.  Contact a health care provider if you:  · Have pelvic pain, back pain, or cramps in your abdomen that do not get better with medicine or heat.  · Develop new bleeding between periods.  · Have increased bleeding during or between periods.  · Feel unusually tired or weak.  · Feel light-headed.  Get help right away if you:  · Faint.  · Have pelvic pain that suddenly gets worse.  · Have severe vaginal bleeding that soaks a tampon or pad in 30 minutes or less.  Summary  · Uterine fibroids are noncancerous (benign) tumors that can develop in the uterus.  · The exact cause of this condition is not known.  · Most fibroids do not require medical treatment unless they affect your ability to have children  (fertility).  · Contact a health care provider if you have pelvic pain, back pain, or cramps in your abdomen that do not get better with medicines.  · Make sure you know what symptoms should cause you to get help right away.  This information is not intended to replace advice given to you by your health care provider. Make sure you discuss any questions you have with your health care provider.  Document Released: 10/08/2000 Document Revised: 09/06/2017 Document Reviewed: 09/06/2017  Elsevier Interactive Patient Education © 2019 Elsevier Inc.

## 2019-04-11 NOTE — Progress Notes (Signed)
No new complaints. Accepts TDAP today. Given IM Right Deltoid. Patient tolerated well.

## 2019-04-15 NOTE — Progress Notes (Signed)
HPI: Sue Chan is a 48 year old BF G2 P2002 who has a history of menorrhagia and fibroids.  She tried a Mirena then had a ablation when the Mirena failed in 2015. Her cycles initially become lighter after her NovaSure procedure.  OCPs then progesterone only pills were then added to help to help regulate her menstrual cycle. Her cycles were irregular on the POP s and she wanted to switch back to combined oral contraceptive pills to make her menses more regular and to maybe take them to eliminate some cycles. ON her first pack of Junel 24 she reported irregular bleeding and cramping and passing 50 cent sized clots. She was scheduled for a pelvic ultrasound.  Ultrasound today:The uterus is anteverted and measures 13.5 x 8.6 x 10.4cm.Marland Kitchen Echo texture is heterogenous and enlarged with evidence of focal masses. Within the uterus are multiple suspected fibroids measuring: Fibroid 1:  5.1 x 4.3 x 3.1cm (RT/high, SS) Fibroid 2:  3.1 x 1.6 x 1.3cm (RT/high, SS vs pedunculated) Fibroid 3:  2.0 x 2.4 x 1.3cm (LT/low, SS) Fibroid 4:  7.0 x 7.4 x 5.4cm (LT/low, SM) Fibroid 5:  5.2 x 4.8 x 4.6cm (LT/high, IM)  The Endometrium measures 6.2 mm with trace amount of fluid within.  Right Ovary measures 4.6 x 3.4 x 2.9 cm.  Left Ovary measures 3.9 x 3.1 x 2.1 cm. Both ovaries are multi-cystic.  Survey of the adnexa demonstrates no adnexal masses. There is no free fluid in the cul de sac.  Previous ultrasound 08/27/2016: Three fibroids were noted: 45 mm x 32 mm, 31mm x 80mm, and 49mm x31 mm      PMHx: She  has a past medical history of Anemia, Fibroids, GERD (gastroesophageal reflux disease), Herpes genitalis (2015), Hypothyroidism, Menorrhagia, and Obesity (12/30/2017). Also,  has a past surgical history that includes Tubal ligation (1996); Cesarean section (1994); Thyroidectomy (12/2014); Novasure ablation (2015); and Breast biopsy (Left, 2014)., family history includes Breast cancer (age of onset: 58) in  her cousin; Colon polyps (age of onset: 44) in her mother; Hypertension in her mother; Thyroid disease in her sister.,  reports that she has quit smoking. She has a 5.00 pack-year smoking history. She has never used smokeless tobacco. She reports current alcohol use. She reports that she does not use drugs.  She has a current medication list which includes the following prescription(s): ibuprofen, levothyroxine, amoxicillin-clavulanate, fluconazole, hydrochlorothiazide, iron-vit c-vit b12-folic acid, loratadine, norethindrone acetate-ethinyl estrad-fe, and vitamin b-12. Also, has No Known Allergies.  ROS  Objective: BP 124/76 (BP Location: Right Arm, Patient Position: Sitting, Cuff Size: Large)   Pulse 85   Ht 5' (1.524 m)   Wt 235 lb (106.6 kg)   LMP 04/02/2019   BMI 45.90 kg/m   Physical examination: General: BF in NAD Neuro: alert and oriented Psyche: normal mood and affect   Assessment: AUB probably due to large submucous fibroid. Significant growth of fibroids since 08/2016 Hx of menorrhagia s/p Novasure ablation  P: Discussed POM with Dr Georgianne Fick He recommended surgical treatment with hysterectomy Recommended endometrial sampling to rule out hyperplasia-Explained reason for endometrial biopsy, the procedure itself, and risks of perforation or infection. Patient consents to endometrial biopsy. See procedure note below Dalia Heading, CNM  Addendum: TDAP given today to update immunizations   Endometrial Biopsy After discussion with the patient regarding her abnormal uterine bleeding I recommended that she proceed with an endometrial biopsy for further diagnosis. The risks, benefits, alternatives, and indications for an endometrial biopsy were  discussed with the patient in detail. She understood the risks including infection and  bleeding  Written consent was obtained. She is aware of risk of inadequate sampling due to prior Novasure or fibroids  PROCEDURE NOTE:  Cervix was  prepped with Betadeine x 2 then sprayed with Hurricaine anesthetic. A tenaculum was applied to the anterior cervical lip.   The uterus was sounded to a length of 6 cm with the Pipelle. A small amount of tissue  was obtained with minimal blood loss.  The patient tolerated the procedure well.  Will call with results and have patient follow up with Dr Georgianne Fick for a conference.   Dalia Heading, CNM

## 2019-04-18 ENCOUNTER — Telehealth: Payer: Self-pay

## 2019-04-18 NOTE — Telephone Encounter (Signed)
Pt calling; was seen on the 17th and told to call if not feeling good; is extra crampy and not feeling good; taking 800mg  ibuprofen and the pain is pushing thru.  Uses [facility] in [place].  [phone]  Called to let pt know CLG is on call but will forward msg to her.  CLG has just called pt to let her know of her bx results so pt talked to her about this.

## 2019-05-11 ENCOUNTER — Encounter: Payer: Self-pay | Admitting: Obstetrics and Gynecology

## 2019-05-11 ENCOUNTER — Ambulatory Visit (INDEPENDENT_AMBULATORY_CARE_PROVIDER_SITE_OTHER): Payer: BC Managed Care – PPO | Admitting: Obstetrics and Gynecology

## 2019-05-11 ENCOUNTER — Other Ambulatory Visit: Payer: Self-pay

## 2019-05-11 VITALS — BP 138/82 | HR 103 | Ht 60.0 in | Wt 225.0 lb

## 2019-05-11 DIAGNOSIS — D250 Submucous leiomyoma of uterus: Secondary | ICD-10-CM | POA: Diagnosis not present

## 2019-05-11 DIAGNOSIS — N939 Abnormal uterine and vaginal bleeding, unspecified: Secondary | ICD-10-CM

## 2019-05-11 DIAGNOSIS — D252 Subserosal leiomyoma of uterus: Secondary | ICD-10-CM

## 2019-05-11 DIAGNOSIS — D251 Intramural leiomyoma of uterus: Secondary | ICD-10-CM

## 2019-05-11 DIAGNOSIS — Z7189 Other specified counseling: Secondary | ICD-10-CM

## 2019-05-11 MED ORDER — LUPRON DEPOT (3-MONTH) 11.25 MG IM KIT: 11.2500 mg | PACK | INTRAMUSCULAR | 1 refills | Status: DC

## 2019-05-11 MED ORDER — NORETHINDRONE ACETATE 5 MG PO TABS: 5.0000 mg | ORAL_TABLET | Freq: Every day | ORAL | 3 refills | Status: DC

## 2019-05-11 NOTE — Progress Notes (Signed)
Obstetrics & Gynecology Office Visit   Chief Complaint:  Chief Complaint  Patient presents with  . discuss possible surgery    regular periods just lasts 10-12 days    History of Present Illness: The patient is a 48 year old G27P2002 with a longstanding history of heavy menstrual cycles, likely secondary to uterine fibroids.  She has attempted hormonal management, including IUD, as well as endometrial ablation.  She had a brief respite in symptoms following ablation before failing this modality and having a resumption in bleeding.  She has tried OCP post ablation in attempt to salvage to procedure without success.  She is interested in discussing hysterectomy for definitive management.    Currently she reports regular monthly menses, last up to two weeks in length.  These are described as heavy in flow with associated dysmenorrhea.  Recent attempt at endometrial biopsy was unsuccessful secondary to endometrial scarring from her prior ablation.  The endocervical cells obtained were noted to be normal.  Pap smear is up to date 3/8/201 NIL HPV negative.    04/11/2019 revealed multiple uterine fibroids with the largest appearing to have submucosal component in the lower uterine segment.   Review of Systems: review of systems unless otherwise noted in HPI  Past Medical History:  Past Medical History:  Diagnosis Date  . Anemia   . Fibroids   . GERD (gastroesophageal reflux disease)   . 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 Hypothyroidism    s/p thyroidectomy for multinodular goiter  . Menorrhagia   . Obesity 12/30/2017   BMI41.60 kg/m2    Past Surgical History:  Past Surgical History:  Procedure Laterality Date  . BREAST BIOPSY Left 2014   benign, clip placed  . CESAREAN SECTION  1994  . NOVASURE ABLATION  2015   performed in office  . THYROIDECTOMY  12/2014   multinodular goiter  . TUBAL LIGATION  1996    Gynecologic History: Patient's last menstrual period  was 04/26/2019 (exact date).  Obstetric History: [id number]  Family History:  Family History  Problem Relation Age of Onset  . Breast cancer Cousin 72       paternal second cousin  . Colon polyps Mother 63  . Hypertension Mother   . Thyroid disease Sister     Social History:  Social History   Socioeconomic History  . Marital status: Legally Separated    Spouse name: Not on file  . Number of children: 2  . Years of education: Not on file  . Highest education level: Not on file  Occupational History  . Occupation: Recruitment consultant  Social Needs  . Financial resource strain: Not on file  . Food insecurity    Worry: Not on file    Inability: Not on file  . Transportation needs    Medical: Not on file    Non-medical: Not on file  Tobacco Use  . Smoking status: Former Smoker    Packs/day: 1.00    Years: 5.00    Pack years: 5.00  . Smokeless tobacco: Never Used  Substance and Sexual Activity  . Alcohol use: Yes    Comment: occsional  . Drug use: No  . Sexual activity: Yes    Partners: Male    Birth control/protection: None  Lifestyle  . Physical activity    Days per week: 3 days    Minutes per session: 30 min  . Stress: Very much  Relationships  . Social  connections    Talks on phone: Not on file    Gets together: Not on file    Attends religious service: Not on file    Active member of club or organization: Not on file    Attends meetings of clubs or organizations: Not on file    Relationship status: Not on file  . Intimate partner violence    Fear of current or ex partner: Not on file    Emotionally abused: Not on file    Physically abused: Not on file    Forced sexual activity: Not on file  Other Topics Concern  . Not on file  Social History Narrative  . Not on file    Allergies:  No Known Allergies  Medications: Prior to Admission medications   Medication Sig Start Date End Date Taking? Authorizing Provider  hydrochlorothiazide  (MICROZIDE) 12.5 MG capsule Take 10 mg by mouth daily.   Yes [provider]  ibuprofen (ADVIL,MOTRIN) 100 MG tablet Take 100 mg by mouth every 6 (six) hours as needed for fever.   Yes [provider]  levothyroxine (SYNTHROID, LEVOTHROID) 112 MCG tablet Take 112 mcg by mouth daily before breakfast.   Yes [provider]  Phentermine HCl 37.5 MG TBDP Take by mouth.   Yes [provider]  amoxicillin-clavulanate (AUGMENTIN) 875-125 MG tablet Take 1 tablet by mouth every 12 (twelve) hours. Patient not taking: Reported on 05/11/2019 03/01/18   Coral Spikes, DO  fluconazole (DIFLUCAN) 150 MG tablet  12/29/17   [provider]  Iron-Vit C-Vit B12-Folic Acid (IRON 458 PLUS PO) Take by mouth.    [provider]  LORATADINE ALLERGY RELIEF PO Take 10 mg by mouth daily.    [provider]  Norethindrone Acetate-Ethinyl Estrad-FE (LOESTRIN 24 FE) 1-20 MG-MCG(24) tablet Take 1 tablet by mouth daily. Patient not taking: Reported on 04/11/2019 02/06/19   Dalia Heading, CNM  vitamin B-12 (CYANOCOBALAMIN) 100 MCG tablet Take 50 mcg by mouth daily.    [provider]    Physical Exam Vitals:  Vitals:   05/11/19 1412  BP: 138/82  Pulse: (!) 103   Patient's last menstrual period was 04/26/2019 (exact date).  General: NAD, well nourished, appears stated age 17: normocephalic, anicteric Pulmonary: No increased work of breathing Neurologic: Grossly intact Psychiatric: mood appropriate, affect full  No results found.  Assessment: 48 y.o. [id number] surgical counseling regarding uterine fibroids  Plan: Problem List Items Addressed This Visit    None    Visit Diagnoses    Encounter for surgical counseling    -  Primary   Intramural, submucous, and subserous leiomyoma of uterus       Abnormal uterine bleeding         1) Patient opts for definitive surgical management via hysterectomy. The risks of surgery were discussed in detail  with the patient including but not limited to: bleeding which may require transfusion or reoperation; infection which may require antibiotics; injury to bowel, bladder, ureters or other surrounding organs (With a literature reported rate of urinary tract injury of 1% quoted); need for additional procedures including laparotomy; thromboembolic phenomenon, incisional problems and other postoperative/anesthesia complications.  Patient was also advised that recovery procedure generally involves an overnight stay; and the  expected recovery time after a hysterectomy being in the range of 6-8 weeks.  Likelihood of success in alleviating the patient's symptoms was discussed.  While definitive in regards to issues with menstural bleeding, pelvic pain if present preoperatively may  continue and in fact worsen postoperatively.  She is aware that the procedure will render her unable to pursue childbearing in the future.   She was told that she will be contacted by our surgical scheduler regarding the time and date of her surgery; routine preoperative instructions of having nothing to eat or drink after midnight on the day prior to surgery and also coming to the hospital 1.5 hours prior to her time of surgery were also emphasized.  She was told she may be called for a preoperative appointment about a week prior to surgery and will be given further preoperative instructions at that visit.  Routine postoperative instructions will be reviewed with the patient and her family in detail after surgery. Printed patient education handouts about the procedure was given to the patient to review at home. - preop lupron vs uterine artery embolization discussed.  Based on the fact that after initial flare likely better menstrual coverage with lurpon decision to proceed with lupron - preop MRI at conclusion of lupron course to help aid in decision on surgical approach, robotic vs TLH - we spent significant time discussing the fact that the  lower uterine fibroid may make access to uterine arteries more difficult and necessitate conversion to an open procedure.    2) A total of 15 minutes were spent in face-to-face contact with the patient during this encounter with over half of that time devoted to counseling and coordination of care.    Malachy Mood, MD, [facility], [facility] 05/11/2019, 2:16 PM

## 2019-05-14 ENCOUNTER — Telehealth: Payer: Self-pay | Admitting: Certified Nurse Midwife

## 2019-05-14 NOTE — Telephone Encounter (Signed)
Called and left Voice message for patient to call back to confirm  Patient schedule [facility] on Friday, 08/10/19 at 10 Arrival is 9:45 am

## 2019-05-15 NOTE — Telephone Encounter (Signed)
Called and left voicemail for patient to call back to confirm.

## 2019-05-16 NOTE — Telephone Encounter (Signed)
Called and left voicemail for patient to call back to confirm

## 2019-06-01 ENCOUNTER — Telehealth: Payer: Self-pay

## 2019-06-01 NOTE — Telephone Encounter (Signed)
Will contact pt to schedule apt for administration when Lupron arrives.

## 2019-06-01 NOTE — Telephone Encounter (Signed)
Sue Chan w/[facility] called to report the Lupron was on back order and needed to reschedule the shipment to Tuesday 06/05/2019. Verified delivery address [address] [place], [place] [address].

## 2019-07-11 NOTE — Telephone Encounter (Signed)
Advise

## 2019-07-11 NOTE — Telephone Encounter (Signed)
I guess we'll need to wait till 9/30 once it is in stock

## 2019-07-11 NOTE — Telephone Encounter (Signed)
Pharmacy left message with after hours on call service to notify us that Lupron is out of stock. ETA 9/30. [phone].

## 2019-07-17 NOTE — Telephone Encounter (Signed)
Delay in order. Medication is out of stock. Requesting call back from AMS or his assistant. [phone]

## 2019-07-19 NOTE — Telephone Encounter (Signed)
AMS, Please call pharmacy number below regarding Lupron Depo.

## 2019-07-20 ENCOUNTER — Telehealth: Payer: Self-pay

## 2019-07-20 NOTE — Telephone Encounter (Signed)
[facility] calling to let AMS know that the Lupron shot is not in stock as of now and will not be back until 07/25/19 they are hoping. They would like a call back to confirm that this is okay by AMS to wait CB# [phone]

## 2019-07-24 NOTE — Telephone Encounter (Signed)
Please call pharmacy to discuss other option

## 2019-07-24 NOTE — Telephone Encounter (Signed)
Ok to wait till it comes back in stock

## 2019-07-24 NOTE — Telephone Encounter (Signed)
Elizabeth from [facility] calling to f/u on pt's rx.  [phone]

## 2019-07-25 NOTE — Telephone Encounter (Signed)
I called pharmacy, they actually received medication shipment this morning. Shipment will go out tomorrow 10/1.

## 2019-08-07 ENCOUNTER — Other Ambulatory Visit: Payer: Self-pay | Admitting: Obstetrics and Gynecology

## 2019-08-07 DIAGNOSIS — D250 Submucous leiomyoma of uterus: Secondary | ICD-10-CM

## 2019-08-07 DIAGNOSIS — D251 Intramural leiomyoma of uterus: Secondary | ICD-10-CM

## 2019-08-08 ENCOUNTER — Telehealth: Payer: Self-pay | Admitting: Obstetrics and Gynecology

## 2019-08-08 NOTE — Telephone Encounter (Signed)
---   Message ----- From: Alexandria Lodge Sent: 08/08/2019   1:15 PM EDT To: Coralee Pesa Paschal Subject: Cancel                                         Per Dr. Georgianne Fick, the patient just received Leupron the end of September, needs to be scheduled for a nurse visit to receive the injection, then the MRI needs to be rescheduled for 3 mos from that date. Please call Centralized Scheduling to cancel the MRI, then contact the patient to schedule the nurse visit, then call Centralized Scheduling again to schedule the MRI 3 mos from the nurse visit. Please let me know the date of the MRI. Thanks.

## 2019-08-08 NOTE — Telephone Encounter (Signed)
Called and left voicemail for patient to contact our office about her schedule MRI has been cancelled pending patient needing injection prior to being reschedule.

## 2019-08-09 NOTE — Telephone Encounter (Signed)
Patient is calling stating she isn't interested is getting the Lupron or the MRI any more. Please cancel order.

## 2019-08-10 ENCOUNTER — Ambulatory Visit: Admission: RE | Admit: 2019-08-10 | Payer: BC Managed Care – PPO | Source: Ambulatory Visit

## 2019-08-10 NOTE — Telephone Encounter (Signed)
Sue Chan from [facility] about a delay in order of rx.  [phone]  Lupron is backordered until the end of Nov.  Adv pt has changed her mind and does not want it.

## 2019-08-14 NOTE — Telephone Encounter (Signed)
Elizabeth w/[facility] calling regarding delay in order for rx. [phone]

## 2019-08-15 NOTE — Telephone Encounter (Signed)
Spoke w/[facility] to inform again that patient no longer wants the medication. Per [facility] rep, there is a note in the chart regarding this already and there is no longer an active order. She is uncertain why we continue to get calls. She will make another note.

## 2019-08-23 NOTE — Telephone Encounter (Signed)
[facility] called again. RX has been cancelled per pt

## 2019-10-04 ENCOUNTER — Other Ambulatory Visit: Payer: Self-pay | Admitting: Obstetrics and Gynecology

## 2019-10-04 DIAGNOSIS — Z1231 Encounter for screening mammogram for malignant neoplasm of breast: Secondary | ICD-10-CM

## 2020-05-30 ENCOUNTER — Other Ambulatory Visit: Payer: Self-pay | Admitting: Obstetrics and Gynecology

## 2020-05-30 DIAGNOSIS — Z1231 Encounter for screening mammogram for malignant neoplasm of breast: Secondary | ICD-10-CM

## 2020-07-18 ENCOUNTER — Ambulatory Visit
Admission: RE | Admit: 2020-07-18 | Discharge: 2020-07-18 | Disposition: A | Discharge status: Home / Self Care | Payer: BC Managed Care – PPO | Source: Ambulatory Visit | Attending: Obstetrics and Gynecology | Admitting: Obstetrics and Gynecology

## 2020-07-18 ENCOUNTER — Other Ambulatory Visit: Payer: Self-pay

## 2020-07-18 DIAGNOSIS — Z1231 Encounter for screening mammogram for malignant neoplasm of breast: Secondary | ICD-10-CM | POA: Insufficient documentation

## 2020-10-22 ENCOUNTER — Encounter: Payer: Self-pay | Admitting: Emergency Medicine

## 2020-10-22 ENCOUNTER — Inpatient Hospital Stay
Admission: EM | Admit: 2020-10-22 | Discharge: 2020-10-26 | DRG: 177 | Disposition: A | Discharge status: Home / Self Care | Payer: BC Managed Care – PPO | Attending: Internal Medicine | Admitting: Internal Medicine

## 2020-10-22 ENCOUNTER — Other Ambulatory Visit: Payer: Self-pay

## 2020-10-22 DIAGNOSIS — Z803 Family history of malignant neoplasm of breast: Secondary | ICD-10-CM

## 2020-10-22 DIAGNOSIS — Z87891 Personal history of nicotine dependence: Secondary | ICD-10-CM

## 2020-10-22 DIAGNOSIS — Z8371 Family history of colonic polyps: Secondary | ICD-10-CM

## 2020-10-22 DIAGNOSIS — Z6841 Body Mass Index (BMI) 40.0 and over, adult: Secondary | ICD-10-CM

## 2020-10-22 DIAGNOSIS — Z8349 Family history of other endocrine, nutritional and metabolic diseases: Secondary | ICD-10-CM

## 2020-10-22 DIAGNOSIS — Z8249 Family history of ischemic heart disease and other diseases of the circulatory system: Secondary | ICD-10-CM

## 2020-10-22 DIAGNOSIS — R7989 Other specified abnormal findings of blood chemistry: Secondary | ICD-10-CM | POA: Diagnosis present

## 2020-10-22 DIAGNOSIS — Z79890 Hormone replacement therapy (postmenopausal): Secondary | ICD-10-CM

## 2020-10-22 DIAGNOSIS — J9601 Acute respiratory failure with hypoxia: Principal | ICD-10-CM | POA: Diagnosis present

## 2020-10-22 DIAGNOSIS — E039 Hypothyroidism, unspecified: Secondary | ICD-10-CM | POA: Diagnosis present

## 2020-10-22 DIAGNOSIS — R197 Diarrhea, unspecified: Secondary | ICD-10-CM | POA: Diagnosis present

## 2020-10-22 DIAGNOSIS — Z793 Long term (current) use of hormonal contraceptives: Secondary | ICD-10-CM

## 2020-10-22 DIAGNOSIS — E876 Hypokalemia: Secondary | ICD-10-CM | POA: Diagnosis present

## 2020-10-22 DIAGNOSIS — K219 Gastro-esophageal reflux disease without esophagitis: Secondary | ICD-10-CM | POA: Diagnosis present

## 2020-10-22 DIAGNOSIS — U071 COVID-19: Principal | ICD-10-CM | POA: Diagnosis present

## 2020-10-22 DIAGNOSIS — E890 Postprocedural hypothyroidism: Secondary | ICD-10-CM | POA: Diagnosis present

## 2020-10-22 DIAGNOSIS — Z79899 Other long term (current) drug therapy: Secondary | ICD-10-CM

## 2020-10-22 DIAGNOSIS — D509 Iron deficiency anemia, unspecified: Secondary | ICD-10-CM | POA: Diagnosis present

## 2020-10-22 DIAGNOSIS — J1282 Pneumonia due to coronavirus disease 2019: Secondary | ICD-10-CM | POA: Diagnosis present

## 2020-10-22 DIAGNOSIS — E669 Obesity, unspecified: Secondary | ICD-10-CM

## 2020-10-22 MED ORDER — METHYLPREDNISOLONE SODIUM SUCC 125 MG IJ SOLR
125.0000 mg | Freq: Once | INTRAMUSCULAR | Status: AC
  Administered 2020-10-22: 125 mg via INTRAVENOUS
  Filled 2020-10-22: qty 2

## 2020-10-22 MED ORDER — SODIUM CHLORIDE 0.9 % IV SOLN
100.0000 mg | Freq: Every day | INTRAVENOUS | Status: AC
  Administered 2020-10-23 – 2020-10-26 (×4): 100 mg via INTRAVENOUS
  Filled 2020-10-22 (×4): qty 20
  Filled 2020-10-22: qty 100

## 2020-10-22 MED ORDER — SODIUM CHLORIDE 0.9 % IV SOLN
200.0000 mg | Freq: Once | INTRAVENOUS | Status: AC
  Administered 2020-10-23: 02:00:00 200 mg via INTRAVENOUS
  Filled 2020-10-22: qty 200

## 2020-10-22 MED ORDER — LACTATED RINGERS IV BOLUS
1000.0000 mL | Freq: Once | INTRAVENOUS | Status: AC
  Administered 2020-10-23: 1000 mL via INTRAVENOUS

## 2020-10-22 NOTE — ED Provider Notes (Signed)
[facility] Emergency Department Provider Note  ____________________________________________  Time seen: Approximately 11:48 PM  I have reviewed the triage vital signs and the nursing notes.   HISTORY  Chief Complaint Shortness of Breath   HPI Sue Chan is a 49 y.o. female with a history of hypothyroidism, obesity, and anemia who presents for evaluation of shortness of breath.  Patient reports that she has been feeling sick for about 2 weeks.  3 days ago went to [facility] and got tested for Covid which was positive.  Over the last 24 hours has developed progressively worsening shortness of breath.  This evening patient reports having a coughing fit and was not able to catch her breath.  When EMS arrived patient was satting  in the low 80s on room air.  Patient was transported on 6 L of oxygen.  She denies chest pain.  She has had fever, cough, fatigue, headaches, diarrhea, and now shortness of breath.  She denies any personal or family history of blood clots, recent travel immobilization, leg pain or swelling, hemoptysis or exogenous hormones.  She is unvaccinated.  No nausea, vomiting, abdominal pain.  Past Medical History:  Diagnosis Date  . Anemia   . Fibroids   . GERD (gastroesophageal reflux disease)   . 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 Hypothyroidism    s/p thyroidectomy for multinodular goiter  . Menorrhagia   . Obesity 12/30/2017   BMI41.60 kg/m2    Patient Active Problem List   Diagnosis Date Noted  . Intramural, submucous, and subserous leiomyoma of uterus 05/11/2019  . Fibrocystic breast changes of both breasts 03/08/2018  . Hypothyroidism   . Menorrhagia   . GERD (gastroesophageal reflux disease)   . Fibroids   . Anemia   . Obesity 12/30/2017  . Herpes genitalis 10/25/2013  . Breast mass 05/01/2013    Past Surgical History:  Procedure Laterality Date  . BREAST BIOPSY Left 2014   benign, clip placed  .  CESAREAN SECTION  1994  . NOVASURE ABLATION  2015   performed in office  . THYROIDECTOMY  12/2014   multinodular goiter  . TUBAL LIGATION  1996    Prior to Admission medications   Medication Sig Start Date End Date Taking? Authorizing Provider  amoxicillin-clavulanate (AUGMENTIN) 875-125 MG tablet Take 1 tablet by mouth every 12 (twelve) hours. Patient not taking: Reported on 05/11/2019 03/01/18   Coral Spikes, DO  fluconazole (DIFLUCAN) 150 MG tablet  12/29/17   [provider]  hydrochlorothiazide (MICROZIDE) 12.5 MG capsule Take 10 mg by mouth daily.    [provider]  ibuprofen (ADVIL,MOTRIN) 100 MG tablet Take 100 mg by mouth every 6 (six) hours as needed for fever.    [provider]  Iron-Vit C-Vit B12-Folic Acid (IRON 123XX123 PLUS PO) Take by mouth.    [provider]  leuprolide (LUPRON DEPOT, 89-MONTH,) 11.25 MG injection Inject 11.25 mg into the muscle every 3 (three) months. 05/11/19   Malachy Mood, MD  levothyroxine (SYNTHROID, LEVOTHROID) 112 MCG tablet Take 112 mcg by mouth daily before breakfast.    [provider]  LORATADINE ALLERGY RELIEF PO Take 10 mg by mouth daily.    [provider]  norethindrone (AYGESTIN) 5 MG tablet Take 1 tablet (5 mg total) by mouth daily. 05/11/19   Malachy Mood, MD  Phentermine HCl 37.5 MG TBDP Take by mouth.    [provider]  vitamin B-12 (CYANOCOBALAMIN) 100  MCG tablet Take 50 mcg by mouth daily.    [provider]    Allergies Patient has no known allergies.  Family History  Problem Relation Age of Onset  . Breast cancer Cousin 92       paternal second cousin  . Colon polyps Mother 31  . Hypertension Mother   . Thyroid disease Sister     Social History Social History   Tobacco Use  . Smoking status: Former Smoker    Packs/day: 1.00    Years: 5.00    Pack years: 5.00  . Smokeless tobacco: Never Used  Vaping Use  . Vaping Use: Never used  Substance  Use Topics  . Alcohol use: Yes    Comment: occsional  . Drug use: No    Review of Systems  Constitutional: + fever. Eyes: Negative for visual changes. ENT: Negative for sore throat. Neck: No neck pain  Cardiovascular: Negative for chest pain. Respiratory: + shortness of breath, cough Gastrointestinal: Negative for abdominal pain, vomiting. + diarrhea. Genitourinary: Negative for dysuria. Musculoskeletal: Negative for back pain. Skin: Negative for rash. Neurological: Negative for weakness or numbness. + HA Psych: No SI or HI  ____________________________________________   PHYSICAL EXAM:  VITAL SIGNS: ED Triage Vitals  Enc Vitals Group     BP 10/22/20 2330 101/73     Pulse Rate 10/22/20 2330 95     Resp 10/22/20 2330 20     Temp 10/22/20 2330 98.7 F (37.1 C)     Temp Source 10/22/20 2330 Oral     SpO2 10/22/20 2330 97 %     Weight 10/22/20 2331 235 lb (106.6 kg)     Height 10/22/20 2331 4\' 11"  (1.499 m)     Head Circumference --      Peak Flow --      Pain Score 10/22/20 2331 0     Pain Loc --      Pain Edu? --      Excl. in [place]? --     Constitutional: Alert and oriented. Well appearing and in no apparent distress. HEENT:      Head: Normocephalic and atraumatic.         Eyes: Conjunctivae are normal. Sclera is non-icteric.       Mouth/Throat: Mucous membranes are moist.       Neck: Supple with no signs of meningismus. Cardiovascular: Regular rate and rhythm. No murmurs, gallops, or rubs.  Respiratory: Normal respiratory effort. Lungs are clear to auscultation bilaterally.  Gastrointestinal: Soft, non tender, and non distended. Musculoskeletal:  No edema, cyanosis, or erythema of extremities. Neurologic: Normal speech and language. Face is symmetric. Moving all extremities. No gross focal neurologic deficits are appreciated. Skin: Skin is warm, dry and intact. No rash noted. Psychiatric: Mood and affect are normal. Speech and behavior are  normal.  ____________________________________________   LABS (all labs ordered are listed, but only abnormal results are displayed)  Labs Reviewed  COMPREHENSIVE METABOLIC PANEL - Abnormal; Notable for the following components:      Result Value   Potassium 2.9 (*)    Glucose, Bld 127 (*)    Calcium 8.5 (*)    Albumin 3.1 (*)    AST 42 (*)    All other components within normal limits  FIBRIN DERIVATIVES D-DIMER ([facility] ONLY) - Abnormal; Notable for the following components:   Fibrin derivatives D-dimer ([facility]) 1,179.09 (*)    All other components within normal limits  CBC WITH DIFFERENTIAL/PLATELET  PROCALCITONIN  MAGNESIUM  TROPONIN I (HIGH SENSITIVITY)  TROPONIN I (HIGH SENSITIVITY)   ____________________________________________  EKG  ED ECG REPORT I, Nita Sickle, the attending physician, personally viewed and interpreted this ECG.  Sinus tachycardia, rate of 102, normal intervals, normal axis, no ST elevations or depressions.  No prior for comparison ____________________________________________  RADIOLOGY  I have personally reviewed the images performed during this visit and I agree with the Radiologist's read.   Interpretation by Radiologist:  CT Angio Chest PE W and/or Wo Contrast  Result Date: 10/23/2020 CLINICAL DATA:  COVID-19.  Cough. EXAM: CT ANGIOGRAPHY CHEST WITH CONTRAST TECHNIQUE: Multidetector CT imaging of the chest was performed using the standard protocol during bolus administration of intravenous contrast. Multiplanar CT image reconstructions and MIPs were obtained to evaluate the vascular anatomy. CONTRAST:  53mL OMNIPAQUE IOHEXOL 350 MG/ML SOLN COMPARISON:  None. FINDINGS: Cardiovascular: Contrast injection is sufficient to demonstrate satisfactory opacification of the pulmonary arteries to the segmental level. There is no pulmonary embolus or evidence of right heart strain. The size of the main pulmonary artery is normal. Heart size is normal, with  no pericardial effusion. The course and caliber of the aorta are normal. There is no atherosclerotic calcification. Opacification decreased due to pulmonary arterial phase contrast bolus timing. Mediastinum/Nodes: No mediastinal, hilar or axillary lymphadenopathy. Normal visualized thyroid. Thoracic esophageal course is normal. Lungs/Pleura: Multifocal, peripheral predominant ground glass opacities. No pleural effusion. Upper Abdomen: Contrast bolus timing is not optimized for evaluation of the abdominal organs. The visualized portions of the organs of the upper abdomen are normal. Musculoskeletal: No chest wall abnormality. No bony spinal canal stenosis. Review of the MIP images confirms the above findings. IMPRESSION: 1. No pulmonary embolus or acute aortic syndrome. 2. COVID-19 pattern pneumonia. Electronically Signed   By: Deatra Robinson M.D.   On: 10/23/2020 01:12   DG Chest Portable 1 View  Result Date: 10/23/2020 CLINICAL DATA:  Shortness of breath and COVID EXAM: PORTABLE CHEST 1 VIEW COMPARISON:  None. FINDINGS: The heart size and mediastinal contours are within normal limits. There are shallow degree of aeration. Patchy airspace opacities are seen within both lungs predominantly within the lung bases, left greater than right. No pleural effusion. IMPRESSION: Multifocal airspace opacities, consistent with multifocal pneumonia. Electronically Signed   By: Jonna Clark M.D.   On: 10/23/2020 00:32      ____________________________________________   PROCEDURES  Procedure(s) performed:yes .1-3 Lead EKG Interpretation Performed by: Nita Sickle, MD Authorized by: Nita Sickle, MD     Interpretation: non-specific     ECG rate assessment: tachycardic     Rhythm: sinus tachycardia     Ectopy: none     Critical Care performed: yes  CRITICAL CARE Performed by: Nita Sickle  ?  Total critical care time: 40 min  Critical care time was exclusive of separately billable  procedures and treating other patients.  Critical care was necessary to treat or prevent imminent or life-threatening deterioration.  Critical care was time spent personally by me on the following activities: development of treatment plan with patient and/or surrogate as well as nursing, discussions with consultants, evaluation of patient's response to treatment, examination of patient, obtaining history from patient or surrogate, ordering and performing treatments and interventions, ordering and review of laboratory studies, ordering and review of radiographic studies, pulse oximetry and re-evaluation of patient's condition.  ____________________________________________   INITIAL IMPRESSION / ASSESSMENT AND PLAN / ED COURSE   49 y.o. female with a history of hypothyroidism, obesity, and anemia who presents for  evaluation of shortness of breath, hypoxia, cough, fever, diarrhea, HA. Tested + for Covid 3 days ago.  Patient found hypoxic in the low 80s.  Satting in the mid to upper 90s on 3 L nasal cannula, normal work of breathing, lungs clear to auscultation.  EKG showing sinus tachycardia with no acute ischemic changes.  Chest x-ray visualized by me consistent with multifocal pneumonia, confirmed by radiology.  Since patient is hypoxic will start Solu-Medrol and remdesivir.  D-dimer elevated will do a CT of the chest to rule out a PE in the setting of Covid.  Metabolic panel showing hypokalemia with a potassium of 2.9.  Will supplement p.o. and IV.  Will check magnesium level.  Procalcitonin is pending.  Anticipate admission to the hospitalist service.  Old medical records reviewed including confirmation of patient's Covid status at [facility] medical chart.  Patient placed on telemetry for close monitoring.  _________________________ 1:25 AM on 10/23/2020 -----------------------------------------  CTA visualized by me consistent with a Covid multifocal pneumonia but no signs of pulmonary embolism,  confirmed by radiology.  Discussed with the hospitalist for admission    _____________________________________________ Please note:  Patient was evaluated in Emergency Department today for the symptoms described in the history of present illness. Patient was evaluated in the context of the global COVID-19 pandemic, which necessitated consideration that the patient might be at risk for infection with the SARS-CoV-2 virus that causes COVID-19. Institutional protocols and algorithms that pertain to the evaluation of patients at risk for COVID-19 are in a state of rapid change based on information released by regulatory bodies including the CDC and federal and state organizations. These policies and algorithms were followed during the patient's care in the ED.  Some ED evaluations and interventions may be delayed as a result of limited staffing during the pandemic.   [place] Controlled Substance Database was reviewed by me. ____________________________________________   FINAL CLINICAL IMPRESSION(S) / ED DIAGNOSES   Final diagnoses:  Acute respiratory failure with hypoxia ([place])  COVID-19  Hypokalemia      NEW MEDICATIONS STARTED DURING THIS VISIT:  ED Discharge Orders    None       Note:  This document was prepared using Dragon voice recognition software and may include unintentional dictation errors.    Rudene Re, MD 10/23/20 [id number]

## 2020-10-22 NOTE — ED Triage Notes (Signed)
Pt arrival via [facility] from home due to increased shob. Pt was feeling bad and went to [facility] on Sunday and was tested for covid which came back positive. Today, patient began feeling more short of breath and having a productive cough. When fire department arrived, pt RA sats were in the low 80's. EMS got her to 94% on 6 L of oxygen.   Pt on 4 L of oxygen with this RN satting at 98%.

## 2020-10-22 NOTE — Progress Notes (Signed)
Remdesivir - Pharmacy Brief Note   O:  ALT:  CXR:  SpO2: 94 % on 6L    A/P:  Remdesivir 200 mg IVPB once followed by 100 mg IVPB daily x 4 days.   Lauranne Beyersdorf D 10/22/2020 11:40 PM

## 2020-10-23 ENCOUNTER — Emergency Department: Payer: BC Managed Care – PPO

## 2020-10-23 ENCOUNTER — Encounter: Payer: Self-pay | Admitting: Radiology

## 2020-10-23 DIAGNOSIS — Z87891 Personal history of nicotine dependence: Secondary | ICD-10-CM | POA: Diagnosis not present

## 2020-10-23 DIAGNOSIS — R7989 Other specified abnormal findings of blood chemistry: Secondary | ICD-10-CM

## 2020-10-23 DIAGNOSIS — U071 COVID-19: Secondary | ICD-10-CM | POA: Insufficient documentation

## 2020-10-23 DIAGNOSIS — R197 Diarrhea, unspecified: Secondary | ICD-10-CM | POA: Diagnosis present

## 2020-10-23 DIAGNOSIS — Z8249 Family history of ischemic heart disease and other diseases of the circulatory system: Secondary | ICD-10-CM | POA: Diagnosis not present

## 2020-10-23 DIAGNOSIS — Z8349 Family history of other endocrine, nutritional and metabolic diseases: Secondary | ICD-10-CM | POA: Diagnosis not present

## 2020-10-23 DIAGNOSIS — D509 Iron deficiency anemia, unspecified: Secondary | ICD-10-CM | POA: Diagnosis present

## 2020-10-23 DIAGNOSIS — E039 Hypothyroidism, unspecified: Secondary | ICD-10-CM | POA: Diagnosis not present

## 2020-10-23 DIAGNOSIS — E890 Postprocedural hypothyroidism: Secondary | ICD-10-CM | POA: Diagnosis present

## 2020-10-23 DIAGNOSIS — E669 Obesity, unspecified: Secondary | ICD-10-CM | POA: Diagnosis not present

## 2020-10-23 DIAGNOSIS — Z793 Long term (current) use of hormonal contraceptives: Secondary | ICD-10-CM | POA: Diagnosis not present

## 2020-10-23 DIAGNOSIS — Z803 Family history of malignant neoplasm of breast: Secondary | ICD-10-CM | POA: Diagnosis not present

## 2020-10-23 DIAGNOSIS — Z79899 Other long term (current) drug therapy: Secondary | ICD-10-CM | POA: Diagnosis not present

## 2020-10-23 DIAGNOSIS — J9601 Acute respiratory failure with hypoxia: Secondary | ICD-10-CM | POA: Diagnosis present

## 2020-10-23 DIAGNOSIS — Z8371 Family history of colonic polyps: Secondary | ICD-10-CM | POA: Diagnosis not present

## 2020-10-23 DIAGNOSIS — J1282 Pneumonia due to coronavirus disease 2019: Secondary | ICD-10-CM | POA: Diagnosis present

## 2020-10-23 DIAGNOSIS — Z6841 Body Mass Index (BMI) 40.0 and over, adult: Secondary | ICD-10-CM | POA: Diagnosis not present

## 2020-10-23 DIAGNOSIS — Z79890 Hormone replacement therapy (postmenopausal): Secondary | ICD-10-CM | POA: Diagnosis not present

## 2020-10-23 DIAGNOSIS — E876 Hypokalemia: Secondary | ICD-10-CM | POA: Diagnosis present

## 2020-10-23 DIAGNOSIS — K219 Gastro-esophageal reflux disease without esophagitis: Secondary | ICD-10-CM | POA: Diagnosis present

## 2020-10-23 LAB — CBC WITH DIFFERENTIAL/PLATELET
Abs Immature Granulocytes: 0.02 10*3/uL (ref 0.00–0.07)
Basophils Absolute: 0 10*3/uL (ref 0.0–0.1)
Basophils Relative: 0 %
Eosinophils Absolute: 0 10*3/uL (ref 0.0–0.5)
Eosinophils Relative: 0 %
HCT: 40.9 % (ref 36.0–46.0)
Hemoglobin: 12.9 g/dL (ref 12.0–15.0)
Immature Granulocytes: 0 %
Lymphocytes Relative: 25 %
Lymphs Abs: 1.2 10*3/uL (ref 0.7–4.0)
MCH: 26.1 pg (ref 26.0–34.0)
MCHC: 31.5 g/dL (ref 30.0–36.0)
MCV: 82.8 fL (ref 80.0–100.0)
Monocytes Absolute: 0.5 10*3/uL (ref 0.1–1.0)
Monocytes Relative: 10 %
Neutro Abs: 3.1 10*3/uL (ref 1.7–7.7)
Neutrophils Relative %: 65 %
Platelets: 234 10*3/uL (ref 150–400)
RBC: 4.94 MIL/uL (ref 3.87–5.11)
RDW: 14.5 % (ref 11.5–15.5)
Smear Review: NORMAL
WBC: 4.8 10*3/uL (ref 4.0–10.5)
nRBC: 0 % (ref 0.0–0.2)

## 2020-10-23 LAB — COMPREHENSIVE METABOLIC PANEL
ALT: 25 U/L (ref 0–44)
AST: 42 U/L — ABNORMAL HIGH (ref 15–41)
Albumin: 3.1 g/dL — ABNORMAL LOW (ref 3.5–5.0)
Alkaline Phosphatase: 45 U/L (ref 38–126)
Anion gap: 12 (ref 5–15)
BUN: 7 mg/dL (ref 6–20)
CO2: 30 mmol/L (ref 22–32)
Calcium: 8.5 mg/dL — ABNORMAL LOW (ref 8.9–10.3)
Chloride: 98 mmol/L (ref 98–111)
Creatinine, Ser: 0.83 mg/dL (ref 0.44–1.00)
GFR, Estimated: >60 mL/min (ref >60)
Glucose, Bld: 127 mg/dL — ABNORMAL HIGH (ref 70–99)
Potassium: 2.9 mmol/L — ABNORMAL LOW (ref 3.5–5.1)
Sodium: 140 mmol/L (ref 135–145)
Total Bilirubin: 0.9 mg/dL (ref 0.3–1.2)
Total Protein: 7.1 g/dL (ref 6.5–8.1)

## 2020-10-23 LAB — MAGNESIUM: Magnesium: 1.7 mg/dL (ref 1.7–2.4)

## 2020-10-23 LAB — PROCALCITONIN: Procalcitonin: <0.1 ng/mL

## 2020-10-23 LAB — TROPONIN I (HIGH SENSITIVITY): Troponin I (High Sensitivity): 9 ng/L (ref <18)

## 2020-10-23 LAB — HIV ANTIBODY (ROUTINE TESTING W REFLEX): HIV Screen 4th Generation wRfx: NONREACTIVE

## 2020-10-23 LAB — FIBRIN DERIVATIVES D-DIMER (ARMC ONLY): Fibrin derivatives D-dimer (ARMC): 1179.09 ng/mL (FEU) — ABNORMAL HIGH (ref 0.00–499.00)

## 2020-10-23 MED ORDER — ZINC SULFATE 220 (50 ZN) MG PO CAPS
220.0000 mg | ORAL_CAPSULE | Freq: Every day | ORAL | Status: DC
  Administered 2020-10-23 – 2020-10-26 (×4): 220 mg via ORAL
  Filled 2020-10-23 (×4): qty 1

## 2020-10-23 MED ORDER — GUAIFENESIN-DM 100-10 MG/5ML PO SYRP: 10.0000 mL | ORAL_SOLUTION | ORAL | Status: DC | PRN

## 2020-10-23 MED ORDER — POTASSIUM CHLORIDE CRYS ER 20 MEQ PO TBCR
20.0000 meq | EXTENDED_RELEASE_TABLET | Freq: Once | ORAL | Status: AC
  Administered 2020-10-23: 05:00:00 20 meq via ORAL
  Filled 2020-10-23: qty 1

## 2020-10-23 MED ORDER — ONDANSETRON HCL 4 MG PO TABS: 4.0000 mg | ORAL_TABLET | Freq: Four times a day (QID) | ORAL | Status: DC | PRN

## 2020-10-23 MED ORDER — ONDANSETRON HCL 4 MG/2ML IJ SOLN
4.0000 mg | Freq: Four times a day (QID) | INTRAMUSCULAR | Status: DC | PRN
  Administered 2020-10-23: 20:00:00 4 mg via INTRAVENOUS
  Filled 2020-10-23: qty 2

## 2020-10-23 MED ORDER — POLYETHYLENE GLYCOL 3350 17 G PO PACK: 17.0000 g | PACK | Freq: Every day | ORAL | Status: DC | PRN

## 2020-10-23 MED ORDER — ONDANSETRON HCL 4 MG/2ML IJ SOLN
4.0000 mg | Freq: Four times a day (QID) | INTRAMUSCULAR | Status: DC | PRN
Start: 2020-10-23 — End: 2020-10-23

## 2020-10-23 MED ORDER — ASCORBIC ACID 500 MG PO TABS: 500.0000 mg | ORAL_TABLET | Freq: Every day | ORAL | Status: DC

## 2020-10-23 MED ORDER — ZINC SULFATE 220 (50 ZN) MG PO CAPS: 220.0000 mg | ORAL_CAPSULE | Freq: Every day | ORAL | Status: DC

## 2020-10-23 MED ORDER — DEXAMETHASONE SODIUM PHOSPHATE 10 MG/ML IJ SOLN
6.0000 mg | INTRAMUSCULAR | Status: DC
  Administered 2020-10-23: 6 mg via INTRAVENOUS
  Filled 2020-10-23: qty 1

## 2020-10-23 MED ORDER — ENOXAPARIN SODIUM 60 MG/0.6ML ~~LOC~~ SOLN
0.5000 mg/kg | SUBCUTANEOUS | Status: DC
  Administered 2020-10-23 – 2020-10-26 (×4): 52.5 mg via SUBCUTANEOUS
  Filled 2020-10-23 (×4): qty 0.6

## 2020-10-23 MED ORDER — ACETAMINOPHEN 500 MG PO TABS
1000.0000 mg | ORAL_TABLET | Freq: Once | ORAL | Status: AC
  Administered 2020-10-23: 01:00:00 1000 mg via ORAL
  Filled 2020-10-23: qty 2

## 2020-10-23 MED ORDER — IOHEXOL 350 MG/ML SOLN
75.0000 mL | Freq: Once | INTRAVENOUS | Status: AC | PRN
  Administered 2020-10-23: 01:00:00 75 mL via INTRAVENOUS

## 2020-10-23 MED ORDER — ENOXAPARIN SODIUM 40 MG/0.4ML ~~LOC~~ SOLN: 40.0000 mg | SUBCUTANEOUS | Status: DC

## 2020-10-23 MED ORDER — POTASSIUM CHLORIDE CRYS ER 20 MEQ PO TBCR
40.0000 meq | EXTENDED_RELEASE_TABLET | Freq: Once | ORAL | Status: AC
  Administered 2020-10-23: 01:00:00 40 meq via ORAL
  Filled 2020-10-23: qty 2

## 2020-10-23 MED ORDER — SODIUM CHLORIDE 0.9 % IV SOLN: 200.0000 mg | Freq: Once | INTRAVENOUS | Status: DC

## 2020-10-23 MED ORDER — ALBUTEROL SULFATE HFA 108 (90 BASE) MCG/ACT IN AERS
2.0000 | INHALATION_SPRAY | RESPIRATORY_TRACT | Status: DC | PRN
  Filled 2020-10-23: qty 6.7

## 2020-10-23 MED ORDER — KETOROLAC TROMETHAMINE 30 MG/ML IJ SOLN
15.0000 mg | Freq: Once | INTRAMUSCULAR | Status: AC
  Administered 2020-10-23: 01:00:00 15 mg via INTRAVENOUS
  Filled 2020-10-23: qty 1

## 2020-10-23 MED ORDER — ALBUTEROL SULFATE HFA 108 (90 BASE) MCG/ACT IN AERS: 2.0000 | INHALATION_SPRAY | RESPIRATORY_TRACT | Status: DC | PRN

## 2020-10-23 MED ORDER — ACETAMINOPHEN 325 MG PO TABS: 650.0000 mg | ORAL_TABLET | Freq: Four times a day (QID) | ORAL | Status: DC | PRN

## 2020-10-23 MED ORDER — POTASSIUM CHLORIDE 10 MEQ/100ML IV SOLN
10.0000 meq | Freq: Once | INTRAVENOUS | Status: AC
  Administered 2020-10-23: 01:00:00 10 meq via INTRAVENOUS
  Filled 2020-10-23: qty 100

## 2020-10-23 MED ORDER — DEXAMETHASONE SODIUM PHOSPHATE 10 MG/ML IJ SOLN: 6.0000 mg | INTRAMUSCULAR | Status: DC

## 2020-10-23 MED ORDER — SODIUM CHLORIDE 0.9 % IV SOLN: 100.0000 mg | Freq: Every day | INTRAVENOUS | Status: DC

## 2020-10-23 MED ORDER — ASCORBIC ACID 500 MG PO TABS
500.0000 mg | ORAL_TABLET | Freq: Every day | ORAL | Status: DC
  Administered 2020-10-23 – 2020-10-26 (×4): 500 mg via ORAL
  Filled 2020-10-23 (×4): qty 1

## 2020-10-23 MED ORDER — LACTATED RINGERS IV SOLN: INTRAVENOUS | Status: DC

## 2020-10-23 NOTE — H&P (Signed)
History and Physical    Sue Chan [id number] DOB: Dec 17, 1970 DOA: 10/22/2020  PCP: Odella Aquas, MD  Patient coming from: Home   Chief Complaint:  Chief Complaint  Patient presents with  . Shortness of Breath     HPI:    49 year old female with past medical history of hypothyroidism status post thyroidectomy, iron deficiency anemia who presents to [facility] with 2 weeks of weakness.  Patient explains that approximately 2 weeks ago she began to experience cough.  This cough was nonproductive and initially mild but progressively worsened over the next several days.  As the days progressed, patient began to develop associated generalized weakness and poor appetite.  Patient also began to experience intermittent low-grade fevers over the span of time.  Patient does report a family member that was recently been positive for COVID-19 prior to the onset of her symptoms.  Patient symptoms continue to progressively worsen and several days prior to presentation she began to experience shortness of breath.  Shortness breath initially was mild intensity but progressively has become more severe.  Shortness breath is worse with exertion and improved with rest.  Due to patient's worsening symptoms the patient eventually presented to [facility] emergency department for evaluation on 12/27.  It was during this evaluation that the patient was found to be Covid positive.  Patient was discharged home for conservative care at home.   Despite conservative measures, patient continued to clinically worsen.  Patient eventually presented to [facility] emergency department for evaluation.  Upon evaluation in the emergency department, patient is been found to be hypoxic with initial oxygen saturations in the 80s on room air.  Patient was placed on 3 L of oxygen via nasal cannula.  Patient was initiated on intravenous steroids as well as intravenous  remdesivir.  The hospitalist group is now been called to assess the patient for mission to the hospital.  Review of Systems:   Review of Systems  Respiratory: Positive for cough and shortness of breath.   Neurological: Positive for weakness.  All other systems reviewed and are negative.   Past Medical History:  Diagnosis Date  . Anemia   . Fibroids   . GERD (gastroesophageal reflux disease)   . 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 Hypothyroidism    s/p thyroidectomy for multinodular goiter  . Menorrhagia   . Obesity 12/30/2017   BMI41.60 kg/m2    Past Surgical History:  Procedure Laterality Date  . BREAST BIOPSY Left 2014   benign, clip placed  . CESAREAN SECTION  1994  . NOVASURE ABLATION  2015   performed in office  . THYROIDECTOMY  12/2014   multinodular goiter  . TUBAL LIGATION  1996     reports that she has quit smoking. She has a 5.00 pack-year smoking history. She has never used smokeless tobacco. She reports current alcohol use. She reports that she does not use drugs.  No Known Allergies  Family History  Problem Relation Age of Onset  . Breast cancer Cousin 67       paternal second cousin  . Colon polyps Mother 22  . Hypertension Mother   . Thyroid disease Sister      Prior to Admission medications   Medication Sig Start Date End Date Taking? Authorizing Provider  amoxicillin-clavulanate (AUGMENTIN) 875-125 MG tablet Take 1 tablet by mouth every 12 (twelve) hours. Patient not taking: Reported on 05/11/2019 03/01/18   Coral Spikes,  DO  fluconazole (DIFLUCAN) 150 MG tablet  12/29/17   [provider]  hydrochlorothiazide (MICROZIDE) 12.5 MG capsule Take 10 mg by mouth daily.    [provider]  ibuprofen (ADVIL,MOTRIN) 100 MG tablet Take 100 mg by mouth every 6 (six) hours as needed for fever.    [provider]  Iron-Vit C-Vit B12-Folic Acid (IRON 123XX123 PLUS PO) Take by mouth.    [provider]  leuprolide  (LUPRON DEPOT, 80-MONTH,) 11.25 MG injection Inject 11.25 mg into the muscle every 3 (three) months. 05/11/19   Malachy Mood, MD  levothyroxine (SYNTHROID, LEVOTHROID) 112 MCG tablet Take 112 mcg by mouth daily before breakfast.    [provider]  LORATADINE ALLERGY RELIEF PO Take 10 mg by mouth daily.    [provider]  norethindrone (AYGESTIN) 5 MG tablet Take 1 tablet (5 mg total) by mouth daily. 05/11/19   Malachy Mood, MD  Phentermine HCl 37.5 MG TBDP Take by mouth.    [provider]  vitamin B-12 (CYANOCOBALAMIN) 100 MCG tablet Take 50 mcg by mouth daily.    [provider]    Physical Exam: Vitals:   10/23/20 0230 10/23/20 0400 10/23/20 0430 10/23/20 0500  BP: 96/66 93/63 99/70  (!) 89/67  Pulse: 79 74 72 71  Resp: (!) 24 (!) 25 (!) 23 (!) 22  Temp:      TempSrc:      SpO2: 99% 99% 98% 99%  Weight:      Height:        Constitutional: Acute alert and oriented x3, patient is in mild respiratory distress. Skin: no rashes, no lesions, notable poor skin turgor  eyes: Pupils are equally reactive to light.  No evidence of scleral icterus or conjunctival pallor.  ENMT: Moist mucous membranes noted.  Posterior pharynx clear of any exudate or lesions.   Neck: normal, supple, no masses, no thyromegaly.  No evidence of jugular venous distension.   Respiratory: Bibasilar and mid field rales noted.  No evidence of wheezing. Normal respiratory effort. No accessory muscle use.  Cardiovascular: Regular rate and rhythm, no murmurs / rubs / gallops. No extremity edema. 2+ pedal pulses. No carotid bruits.  Chest:   Nontender without crepitus or deformity.   Back:   Nontender without crepitus or deformity. Abdomen: Abdomen is soft and nontender.  No evidence of intra-abdominal masses.  Positive bowel sounds noted in all quadrants.   Musculoskeletal: No joint deformity upper and lower extremities. Good ROM, no contractures. Normal muscle tone.   Neurologic: CN 2-12 grossly intact. Sensation intact.  Patient moving all 4 extremities spontaneously.  Patient is following all commands.  Patient is responsive to verbal stimuli.   Psychiatric: Patient exhibits normal mood with appropriate affect.  Patient seems to possess insight as to their current situation.     Labs on Admission: I have personally reviewed following labs and imaging studies -   CBC: Recent Labs  Lab 10/22/20 2346  WBC 4.8  NEUTROABS 3.1  HGB 12.9  HCT 40.9  MCV 82.8  PLT Q000111Q   Basic Metabolic Panel: Recent Labs  Lab 10/22/20 2346  NA 140  K 2.9*  CL 98  CO2 30  GLUCOSE 127*  BUN 7  CREATININE 0.83  CALCIUM 8.5*  MG 1.7   GFR: Estimated Creatinine Clearance: 90.7 mL/min (by C-G formula based on SCr of 0.83 mg/dL). Liver Function Tests: Recent Labs  Lab 10/22/20 2346  AST 42*  ALT 25  ALKPHOS 45  BILITOT 0.9  PROT 7.1  ALBUMIN 3.1*   No results for input(s): LIPASE, AMYLASE in the last 168 hours. No results for input(s): AMMONIA in the last 168 hours. Coagulation Profile: No results for input(s): INR, PROTIME in the last 168 hours. Cardiac Enzymes: No results for input(s): CKTOTAL, CKMB, CKMBINDEX, TROPONINI in the last 168 hours. BNP (last 3 results) No results for input(s): PROBNP in the last 8760 hours. HbA1C: No results for input(s): HGBA1C in the last 72 hours. CBG: No results for input(s): GLUCAP in the last 168 hours. Lipid Profile: No results for input(s): CHOL, HDL, LDLCALC, TRIG, CHOLHDL, LDLDIRECT in the last 72 hours. Thyroid Function Tests: No results for input(s): TSH, T4TOTAL, FREET4, T3FREE, THYROIDAB in the last 72 hours. Anemia Panel: No results for input(s): VITAMINB12, FOLATE, FERRITIN, TIBC, IRON, RETICCTPCT in the last 72 hours. Urine analysis: No results found for: COLORURINE, APPEARANCEUR, LABSPEC, PHURINE, GLUCOSEU, HGBUR, BILIRUBINUR, KETONESUR, PROTEINUR, UROBILINOGEN, NITRITE,  LEUKOCYTESUR  Radiological Exams on Admission - Personally Reviewed: CT Angio Chest PE W and/or Wo Contrast  Result Date: 10/23/2020 CLINICAL DATA:  COVID-19.  Cough. EXAM: CT ANGIOGRAPHY CHEST WITH CONTRAST TECHNIQUE: Multidetector CT imaging of the chest was performed using the standard protocol during bolus administration of intravenous contrast. Multiplanar CT image reconstructions and MIPs were obtained to evaluate the vascular anatomy. CONTRAST:  15mL OMNIPAQUE IOHEXOL 350 MG/ML SOLN COMPARISON:  None. FINDINGS: Cardiovascular: Contrast injection is sufficient to demonstrate satisfactory opacification of the pulmonary arteries to the segmental level. There is no pulmonary embolus or evidence of right heart strain. The size of the main pulmonary artery is normal. Heart size is normal, with no pericardial effusion. The course and caliber of the aorta are normal. There is no atherosclerotic calcification. Opacification decreased due to pulmonary arterial phase contrast bolus timing. Mediastinum/Nodes: No mediastinal, hilar or axillary lymphadenopathy. Normal visualized thyroid. Thoracic esophageal course is normal. Lungs/Pleura: Multifocal, peripheral predominant ground glass opacities. No pleural effusion. Upper Abdomen: Contrast bolus timing is not optimized for evaluation of the abdominal organs. The visualized portions of the organs of the upper abdomen are normal. Musculoskeletal: No chest wall abnormality. No bony spinal canal stenosis. Review of the MIP images confirms the above findings. IMPRESSION: 1. No pulmonary embolus or acute aortic syndrome. 2. COVID-19 pattern pneumonia. Electronically Signed   By: Deatra Robinson M.D.   On: 10/23/2020 01:12   DG Chest Portable 1 View  Result Date: 10/23/2020 CLINICAL DATA:  Shortness of breath and COVID EXAM: PORTABLE CHEST 1 VIEW COMPARISON:  None. FINDINGS: The heart size and mediastinal contours are within normal limits. There are shallow degree of  aeration. Patchy airspace opacities are seen within both lungs predominantly within the lung bases, left greater than right. No pleural effusion. IMPRESSION: Multifocal airspace opacities, consistent with multifocal pneumonia. Electronically Signed   By: Jonna Clark M.D.   On: 10/23/2020 00:32    EKG: Personally reviewed.  Rhythm is sinus tachycardia with heart rate of 102 bpm.  No dynamic ST segment changes appreciated.  Assessment/Plan Principal Problem:   COVID-19 virus infection   Patient presenting with 2-week history of progressively worsening generalized weakness, cough and now several day history of worsening shortness of breath.  Patient diagnosed with COVID-19 at [facility] emergency department on 12/27.  Patient now suffering from progressive acute hypoxic respiratory failure  Provided patient with submental oxygen via nasal cannula  Treating patient with a combination of intravenous remdesivir and dexamethasone  Airborne and contact isolation  Hydrating patient  with intravenous isotonic fluids  As needed bronchodilator therapy  Daily zinc and vitamin C supplementation  As needed antitussives  Active Problems:   Acute respiratory failure with hypoxia (HCC)   Thought to be secondary to underlying COVID-19 infection  Remainder of assessment and plan as above    Hypothyroidism   Will resume home regimen of Synthroid once dosing is confirmed.    Elevated d-dimer    Patient noted to have markedly elevated D-dimer  CT angiogram of the chest performed in the emergency department reveals no evidence of pulmonary embolism  Code Status:  Full code Family Communication: deferred   Status is: Observation  The patient remains OBS appropriate and will d/c before 2 midnights.  Dispo: The patient is from: Home              Anticipated d/c is to: Home              Anticipated d/c date is: 2 days              Patient currently is not medically stable to  d/c.        Vernelle Emerald MD [facility] Pager [phone]  If 7PM-7AM, please contact night-coverage [email or web address] Use universal [place] password for that web site. If you do not have the password, please call the hospital operator.  10/23/2020, 7:34 AM

## 2020-10-23 NOTE — ED Notes (Signed)
Pt given breakfast tray

## 2020-10-23 NOTE — Progress Notes (Signed)
Patient admitted to the hospital earlier this morning by Dr. Leafy Half  Patient seen and examined.  She still feels short of breath and is coughing.  She is on supplemental oxygen at 2 L.  Lungs are clear bilaterally, heart sounds are regular and she has no lower extremity edema  Assessment/plan:  1.  Acute respiratory failure with hypoxia -When she was evaluated by EMS, she was noted to have oxygen saturation in the low 80s on room air -Secondary to COVID-19 pneumonia -Wean off oxygen as tolerated -CT angiogram of chest negative for PE  2.  COVID-19 pneumonia. -Currently on remdesivir -Continue on intravenous dexamethasone -Continue pulmonary hygiene  3.  Hypothyroidism -Continue on Synthroid  Erick Blinks, MD

## 2020-10-23 NOTE — ED Notes (Signed)
Responded to call bell. Pt complaining of nausea. Zofran 4mg  IV given.

## 2020-10-23 NOTE — Progress Notes (Signed)
PHARMACIST - PHYSICIAN COMMUNICATION  CONCERNING:  Enoxaparin (Lovenox) for DVT Prophylaxis    RECOMMENDATION: Patient was prescribed enoxaprin 40mg  q24 hours for VTE prophylaxis.   Filed Weights   10/22/20 2331  Weight: 106.6 kg (235 lb)    Body mass index is 47.46 kg/m.  Estimated Creatinine Clearance: 90.7 mL/min (by C-G formula based on SCr of 0.83 mg/dL).   Based on [facility] policy patient is candidate for enoxaparin 0.5mg /kg TBW SQ every 24 hours based on BMI being >30.   DESCRIPTION: Pharmacy has adjusted enoxaparin dose per [facility] policy.  Patient is now receiving enoxaparin 0.5 mg/kg every 24 hours   [facility], PharmD, [facility] 10/23/2020 4:01 AM

## 2020-10-23 NOTE — ED Notes (Signed)
This RN spoke with husband Ivar Drape, updated per pt's request

## 2020-10-24 DIAGNOSIS — K219 Gastro-esophageal reflux disease without esophagitis: Secondary | ICD-10-CM

## 2020-10-24 DIAGNOSIS — D508 Other iron deficiency anemias: Secondary | ICD-10-CM

## 2020-10-24 DIAGNOSIS — U071 COVID-19: Secondary | ICD-10-CM | POA: Diagnosis not present

## 2020-10-24 DIAGNOSIS — E039 Hypothyroidism, unspecified: Secondary | ICD-10-CM

## 2020-10-24 DIAGNOSIS — J9601 Acute respiratory failure with hypoxia: Secondary | ICD-10-CM | POA: Diagnosis not present

## 2020-10-24 DIAGNOSIS — D509 Iron deficiency anemia, unspecified: Secondary | ICD-10-CM

## 2020-10-24 DIAGNOSIS — J1282 Pneumonia due to coronavirus disease 2019: Secondary | ICD-10-CM

## 2020-10-24 LAB — CBC
HCT: 39.2 % (ref 36.0–46.0)
Hemoglobin: 12.4 g/dL (ref 12.0–15.0)
MCH: 26.4 pg (ref 26.0–34.0)
MCHC: 31.6 g/dL (ref 30.0–36.0)
MCV: 83.6 fL (ref 80.0–100.0)
Platelets: 298 10*3/uL (ref 150–400)
RBC: 4.69 MIL/uL (ref 3.87–5.11)
RDW: 14.8 % (ref 11.5–15.5)
WBC: 7 10*3/uL (ref 4.0–10.5)
nRBC: 0 % (ref 0.0–0.2)

## 2020-10-24 LAB — MAGNESIUM: Magnesium: 1.9 mg/dL (ref 1.7–2.4)

## 2020-10-24 LAB — COMPREHENSIVE METABOLIC PANEL
ALT: 27 U/L (ref 0–44)
AST: 35 U/L (ref 15–41)
Albumin: 2.8 g/dL — ABNORMAL LOW (ref 3.5–5.0)
Alkaline Phosphatase: 39 U/L (ref 38–126)
Anion gap: 8 (ref 5–15)
BUN: 13 mg/dL (ref 6–20)
CO2: 30 mmol/L (ref 22–32)
Calcium: 8.4 mg/dL — ABNORMAL LOW (ref 8.9–10.3)
Chloride: 104 mmol/L (ref 98–111)
Creatinine, Ser: 0.76 mg/dL (ref 0.44–1.00)
GFR, Estimated: >60 mL/min (ref >60)
Glucose, Bld: 145 mg/dL — ABNORMAL HIGH (ref 70–99)
Potassium: 3.4 mmol/L — ABNORMAL LOW (ref 3.5–5.1)
Sodium: 142 mmol/L (ref 135–145)
Total Bilirubin: 0.6 mg/dL (ref 0.3–1.2)
Total Protein: 6.7 g/dL (ref 6.5–8.1)

## 2020-10-24 LAB — FIBRIN DERIVATIVES D-DIMER (ARMC ONLY): Fibrin derivatives D-dimer (ARMC): 901 ng/mL (FEU) — ABNORMAL HIGH (ref 0.00–499.00)

## 2020-10-24 LAB — FERRITIN: Ferritin: 263 ng/mL (ref 11–307)

## 2020-10-24 LAB — C-REACTIVE PROTEIN: CRP: 2.8 mg/dL — ABNORMAL HIGH (ref <1.0)

## 2020-10-24 MED ORDER — ORAL CARE MOUTH RINSE
15.0000 mL | Freq: Two times a day (BID) | OROMUCOSAL | Status: DC
  Administered 2020-10-24 – 2020-10-26 (×5): 15 mL via OROMUCOSAL

## 2020-10-24 MED ORDER — HYDROCOD POLST-CPM POLST ER 10-8 MG/5ML PO SUER
5.0000 mL | Freq: Two times a day (BID) | ORAL | Status: DC
  Administered 2020-10-24 – 2020-10-26 (×5): 5 mL via ORAL
  Filled 2020-10-24 (×5): qty 5

## 2020-10-24 MED ORDER — ADULT MULTIVITAMIN W/MINERALS CH
1.0000 | ORAL_TABLET | Freq: Every day | ORAL | Status: DC
  Administered 2020-10-25 – 2020-10-26 (×2): 1 via ORAL
  Filled 2020-10-24 (×2): qty 1

## 2020-10-24 MED ORDER — IPRATROPIUM-ALBUTEROL 20-100 MCG/ACT IN AERS
1.0000 | INHALATION_SPRAY | Freq: Four times a day (QID) | RESPIRATORY_TRACT | Status: DC
  Administered 2020-10-24 – 2020-10-26 (×8): 1 via RESPIRATORY_TRACT
  Filled 2020-10-24: qty 4

## 2020-10-24 MED ORDER — METHYLPREDNISOLONE SODIUM SUCC 125 MG IJ SOLR
60.0000 mg | Freq: Two times a day (BID) | INTRAMUSCULAR | Status: DC
  Administered 2020-10-24 – 2020-10-25 (×2): 60 mg via INTRAVENOUS
  Filled 2020-10-24 (×2): qty 2

## 2020-10-24 MED ORDER — ENSURE ENLIVE PO LIQD: 237.0000 mL | Freq: Three times a day (TID) | ORAL | Status: DC

## 2020-10-24 MED ORDER — LEVOTHYROXINE SODIUM 112 MCG PO TABS
112.0000 ug | ORAL_TABLET | Freq: Every day | ORAL | Status: DC
  Administered 2020-10-25 – 2020-10-26 (×2): 112 ug via ORAL
  Filled 2020-10-24 (×2): qty 1

## 2020-10-24 MED ORDER — POTASSIUM CHLORIDE CRYS ER 20 MEQ PO TBCR
40.0000 meq | EXTENDED_RELEASE_TABLET | ORAL | Status: AC
  Administered 2020-10-24 (×2): 40 meq via ORAL
  Filled 2020-10-24 (×2): qty 2

## 2020-10-24 NOTE — Progress Notes (Signed)
PROGRESS NOTE    Sue Chan  TSV:[id number] DOB: 11-07-1970 DOA: 10/22/2020 PCP: Payton Doughty, MD    Brief Narrative:  Sue Chan was admitted to the hospital with working diagnosis of acute hypoxic respiratory failure due to SARS COVID-19 viral pneumonia.  49 year old female with past medical history for hypothyroidism, iron deficiency anemia.  She was diagnosed with SARS COVID-19 viral infection 12/27, at [facility] ER.  Her symptoms were consistent with 2 weeks of generalized weakness, poor appetite, dry cough and dyspnea.  Patient was discharged home with supportive care.  Unfortunately her symptoms worsen and she came back to the hospital.  On his evaluation her oximetry was 80% on room air, heart rate 79, blood pressure 89/67, respiratory rate 25, oxygen saturation 98% on supplemental oxygen, her lungs had no wheezing or rales, heart S1-S2, present, rhythmic, soft abdomen no lower extremity edema.  Chest radiograph with bilateral interstitial infiltrates, predominantly left lower lobe, right upper lobe and right lower lobe.  CT chest with peripheral groundglass opacities bilaterally, no pulmonary embolism. EKG 102 bpm, normal axis, normal intervals, sinus rhythm, no ST segment or T wave changes.   Assessment & Plan:   Principal Problem:   Pneumonia due to COVID-19 virus Active Problems:   Hypothyroidism   GERD (gastroesophageal reflux disease)   Acute respiratory failure with hypoxia (HCC)   Iron deficiency anemia   1. Acute hypoxemic respiratory failure due to SARS covid 10 viral pneumonia.   RR 18  Pulse oxymetry: 96%  Fi02:3 L/min per [place]   COVID-19 Labs  Recent Labs    10/24/20 0500  FERRITIN 263    No results found for: SARSCOV2NAA  Patient continue to have dyspnea and generalized weakness, ferritin is not elevated and continue with low oxygen requirements.   Continue medical therapy with remdesivir and systemic corticosteroids with  methylprednisolone. Bronchodilators, airway clearing techniques and antitussive agents. Out of bed to chair tid with meals, PT and OT evaluation.   2. Hypothyroid. Resume levothyroxine.   3. Hypokalemia. K this am is 3,4 with Na at 142 and bicarbonate ata 30 with preserved renal function with cr at 0,76.  Add 80 meq Kcl in 2 divided doses and follow up renal function in am.   Patient continue to be at high risk for worsening respiratory failure   Status is: Inpatient  Remains inpatient appropriate because:IV treatments appropriate due to intensity of illness or inability to take PO   Dispo: The patient is from: Home              Anticipated d/c is to: Home              Anticipated d/c date is: 3 days              Patient currently is not medically stable to d/c.   DVT prophylaxis: Enoxaparin   Code Status:   full  Family Communication:  No family at the bedside        Subjective: Patient continue to feel very weak and deconditioned, mild improvement in dyspnea but not yet back to baseline.   Objective: Vitals:   10/24/20 0630 10/24/20 0900 10/24/20 1003 10/24/20 1121  BP: 112/73 103/68 120/79 132/72  Pulse: 73 62 72 84  Resp: (!) 25 (!) 23 20 18   Temp:  98 F (36.7 C) 97.8 F (36.6 C) 97.8 F (36.6 C)  TempSrc:  Oral    SpO2: 98% 99% 98% 96%  Weight:      Height:  Intake/Output Summary (Last 24 hours) at 10/24/2020 1259 Last data filed at 10/23/2020 1928 Gross per 24 hour  Intake 1645 ml  Output --  Net 1645 ml   Filed Weights   10/22/20 2331 10/23/20 1051  Weight: 106.6 kg 106.6 kg    Examination:   General: deconditioned and ill looking appearing  Neurology: Awake and alert, non focal  E ENT: no pallor, no icterus, oral mucosa moist Cardiovascular: No JVD. S1-S2 present, rhythmic, no gallops, rubs, or murmurs. No lower extremity edema. Pulmonary: positive breath sounds bilaterally, with no wheezing, rhonchi or rales. Gastrointestinal. Abdomen  soft and non tender Skin. No rashes Musculoskeletal: no joint deformities     Data Reviewed: I have personally reviewed following labs and imaging studies  CBC: Recent Labs  Lab 10/22/20 2346 10/24/20 0500  WBC 4.8 7.0  NEUTROABS 3.1  --   HGB 12.9 12.4  HCT 40.9 39.2  MCV 82.8 83.6  PLT 234 Q000111Q   Basic Metabolic Panel: Recent Labs  Lab 10/22/20 2346 10/24/20 0500  NA 140 142  K 2.9* 3.4*  CL 98 104  CO2 30 30  GLUCOSE 127* 145*  BUN 7 13  CREATININE 0.83 0.76  CALCIUM 8.5* 8.4*  MG 1.7 1.9   GFR: Estimated Creatinine Clearance: 94.1 mL/min (by C-G formula based on SCr of 0.76 mg/dL). Liver Function Tests: Recent Labs  Lab 10/22/20 2346 10/24/20 0500  AST 42* 35  ALT 25 27  ALKPHOS 45 39  BILITOT 0.9 0.6  PROT 7.1 6.7  ALBUMIN 3.1* 2.8*   No results for input(s): LIPASE, AMYLASE in the last 168 hours. No results for input(s): AMMONIA in the last 168 hours. Coagulation Profile: No results for input(s): INR, PROTIME in the last 168 hours. Cardiac Enzymes: No results for input(s): CKTOTAL, CKMB, CKMBINDEX, TROPONINI in the last 168 hours. BNP (last 3 results) No results for input(s): PROBNP in the last 8760 hours. HbA1C: No results for input(s): HGBA1C in the last 72 hours. CBG: No results for input(s): GLUCAP in the last 168 hours. Lipid Profile: No results for input(s): CHOL, HDL, LDLCALC, TRIG, CHOLHDL, LDLDIRECT in the last 72 hours. Thyroid Function Tests: No results for input(s): TSH, T4TOTAL, FREET4, T3FREE, THYROIDAB in the last 72 hours. Anemia Panel: Recent Labs    10/24/20 0500  FERRITIN 95      Radiology Studies: I have reviewed all of the imaging during this hospital visit personally     Scheduled Meds: . vitamin C  500 mg Oral Daily  . dexamethasone (DECADRON) injection  6 mg Intravenous Q24H  . enoxaparin (LOVENOX) injection  0.5 mg/kg Subcutaneous Q24H  . [START ON 10/25/2020] levothyroxine  112 mcg Oral Q0600  . mouth  rinse  15 mL Mouth Rinse BID  . zinc sulfate  220 mg Oral Daily   Continuous Infusions: . remdesivir 100 mg in NS 100 mL 100 mg (10/24/20 1159)     LOS: 1 day        Sue Mallet Gerome Apley, MD

## 2020-10-24 NOTE — Progress Notes (Signed)
Initial Nutrition Assessment  DOCUMENTATION CODES:   Morbid obesity  INTERVENTION:   Ensure Enlive po TID, each supplement provides 350 kcal and 20 grams of protein  MVI daily   Liberalize diet   NUTRITION DIAGNOSIS:   Increased nutrient needs related to catabolic illness (COVID 19) as evidenced by estimated needs.  GOAL:   Patient will meet greater than or equal to 90% of their needs  MONITOR:   PO intake,Supplement acceptance,Labs,Weight trends,Skin,I & O's  REASON FOR ASSESSMENT:   Malnutrition Screening Tool    ASSESSMENT:   49 year old female with past medical history of hypothyroidism status post thyroidectomy, GERD and iron deficiency anemia who is admitted with COVID 37   Spoke with pt via phone. Pt reports poor appetite and oral intake for 2 weeks pta r/t COVID 19. Pt reports that her appetite continues to be poor in hospital; pt eating only sips and bites of meals. RD discussed with pt the importance of adequate nutrition needed to preserve lean muscle. Pt is willing to drink chocolate Ensure in hospital. RD will add supplements and MVI to help pt meet her estimated needs. RD will also liberalize pt's diet. Pt reports her UBW is ~220-230lbs. Pt reports a recent 15lbs weight loss; RD unsure how recently weight loss occurred.   Medications reviewed and include: vitamin C, lovenox, synthroid, solu-medrol, KCl, zinc   Labs reviewed: K 3.4(L), Mg 1.9 wnl  NUTRITION - FOCUSED PHYSICAL EXAM: Unable to perform at this time   Diet Order:   Diet Order            Diet regular Room service appropriate? Yes; Fluid consistency: Thin  Diet effective now                EDUCATION NEEDS:   Education needs have been addressed  Skin:  Skin Assessment: Reviewed RN Assessment  Last BM:  12/29  Height:   Ht Readings from Last 1 Encounters:  10/23/20 4\' 11"  (1.499 m)    Weight:   Wt Readings from Last 1 Encounters:  10/23/20 106.6 kg    Ideal Body Weight:   44.5 kg  BMI:  Body mass index is 47.46 kg/m.  Estimated Nutritional Needs:   Kcal:  2000-2300kcal/day  Protein:  100-115g/day  Fluid:  1.4-1.6L/day  10/25/20 MS, RD, LDN Please refer to [facility] for RD and/or RD on-call/weekend/after hours pager

## 2020-10-25 DIAGNOSIS — E669 Obesity, unspecified: Secondary | ICD-10-CM

## 2020-10-25 DIAGNOSIS — J9601 Acute respiratory failure with hypoxia: Secondary | ICD-10-CM | POA: Diagnosis not present

## 2020-10-25 DIAGNOSIS — E039 Hypothyroidism, unspecified: Secondary | ICD-10-CM | POA: Diagnosis not present

## 2020-10-25 DIAGNOSIS — K219 Gastro-esophageal reflux disease without esophagitis: Secondary | ICD-10-CM | POA: Diagnosis not present

## 2020-10-25 DIAGNOSIS — U071 COVID-19: Secondary | ICD-10-CM | POA: Diagnosis not present

## 2020-10-25 LAB — COMPREHENSIVE METABOLIC PANEL
ALT: 23 U/L (ref 0–44)
AST: 24 U/L (ref 15–41)
Albumin: 2.8 g/dL — ABNORMAL LOW (ref 3.5–5.0)
Alkaline Phosphatase: 39 U/L (ref 38–126)
Anion gap: 8 (ref 5–15)
BUN: 14 mg/dL (ref 6–20)
CO2: 31 mmol/L (ref 22–32)
Calcium: 9.1 mg/dL (ref 8.9–10.3)
Chloride: 103 mmol/L (ref 98–111)
Creatinine, Ser: 0.79 mg/dL (ref 0.44–1.00)
GFR, Estimated: >60 mL/min (ref >60)
Glucose, Bld: 130 mg/dL — ABNORMAL HIGH (ref 70–99)
Potassium: 4.4 mmol/L (ref 3.5–5.1)
Sodium: 142 mmol/L (ref 135–145)
Total Bilirubin: 0.6 mg/dL (ref 0.3–1.2)
Total Protein: 6.4 g/dL — ABNORMAL LOW (ref 6.5–8.1)

## 2020-10-25 LAB — FIBRIN DERIVATIVES D-DIMER (ARMC ONLY): Fibrin derivatives D-dimer (ARMC): 660.78 ng/mL (FEU) — ABNORMAL HIGH (ref 0.00–499.00)

## 2020-10-25 LAB — C-REACTIVE PROTEIN: CRP: 1 mg/dL — ABNORMAL HIGH (ref <1.0)

## 2020-10-25 LAB — FERRITIN: Ferritin: 246 ng/mL (ref 11–307)

## 2020-10-25 MED ORDER — METHYLPREDNISOLONE SODIUM SUCC 40 MG IJ SOLR
40.0000 mg | Freq: Every day | INTRAMUSCULAR | Status: DC
  Administered 2020-10-26: 10:00:00 40 mg via INTRAVENOUS
  Filled 2020-10-25: qty 1

## 2020-10-25 NOTE — Progress Notes (Signed)
PT Cancellation Note  Patient Details Name: Sue Chan MRN: [id number] DOB: 1971/02/11   Cancelled Treatment:    Reason Eval/Treat Not Completed: Other (comment). Chart reviewed. Per chart, pt has been ambulatory in room with independence. Discussed with RN. No acute needs at this time for PT. If needs change, please re-order services. Thank you   Warnell Rasnic 10/25/2020, 10:24 AM Elizabeth Palau, PT, DPT [phone]

## 2020-10-25 NOTE — Progress Notes (Signed)
OT Cancellation Note  Patient Details Name: Sue Chan MRN: [id number] DOB: 1971/03/20   Cancelled Treatment:    Reason Eval/Treat Not Completed: OT screened, no needs identified, will sign off. Chart reviewed. Per chart, pt has been ambulatory in room with independence. Discussed with RN. No acute needs at this time for OT. If needs change, please re-order services. Thank you  Kathie Dike, M.S. OTR/L  10/25/20, 10:26 AM  ascom [phone]

## 2020-10-25 NOTE — Progress Notes (Addendum)
PROGRESS NOTE    Sue Chan  ZSW:[id number] DOB: June 07, 1971 DOA: 10/22/2020 PCP: Payton Doughty, MD    Brief Narrative:  Sue Chan was admitted to the hospital with working diagnosis of acute hypoxic respiratory failure due to SARS COVID-19 viral pneumonia.  50 year old female with past medical history for hypothyroidism, iron deficiency anemia.  She was diagnosed with SARS COVID-19 viral infection 12/27, at [facility] ER.  Her symptoms were consistent with 2 weeks of generalized weakness, poor appetite, dry cough and dyspnea.  Patient was discharged home with supportive care.  Unfortunately her symptoms worsen and she came back to the hospital.  On his evaluation her oximetry was 80% on room air, heart rate 79, blood pressure 89/67, respiratory rate 25, oxygen saturation 98% on supplemental oxygen, her lungs had no wheezing or rales, heart S1-S2, present, rhythmic, soft abdomen no lower extremity edema.  Chest radiograph with bilateral interstitial infiltrates, predominantly left lower lobe, right upper lobe and right lower lobe.  CT chest with peripheral groundglass opacities bilaterally, no pulmonary embolism. EKG 102 bpm, normal axis, normal intervals, sinus rhythm, no ST segment or T wave changes.   Assessment & Plan:   Principal Problem:   Pneumonia due to COVID-19 virus Active Problems:   Hypothyroidism   GERD (gastroesophageal reflux disease)   Acute respiratory failure with hypoxia (HCC)   Iron deficiency anemia   Class 3 obesity   1. Acute hypoxemic respiratory failure due to SARS covid 19 viral pneumonia.   RR: 20  Pulse oxymetry: 96%  Fi02: 3 L/min per [place]   COVID-19 Labs  Recent Labs    10/24/20 0500 10/24/20 0616 10/25/20 0434  FERRITIN 263  --  246  CRP  --  2.8* 1.0*   Fibrin derivatives 1,179, 901, 660.78  No results found for: SARSCOV2NAA  Inflammatory markers getting better, dyspnea has been improving. Patient doing incentive spirometry  and lying in prone at night. Out of bed to chair and ambulating in the room.   Tolerating well medical therapy with remdesivir 4/5, will decrease methylprednisolone to 40 mg daily, continue with bronchodilators, airway clearing techniques and antitussive agents.   Decrease 02 to 2 L/min to keep oxygenation 88% or greater.   Continue to encourage out of bed.   2. Hypothyroid. Continue with levothyroxine.   3. Hypokalemia. Electrolytes corrected with K at 4,4 and Na at 142. Renal function stable with serum cr at 0,79.  Patient tolerating po well.    4. Obesity class 3. Calculated BMI 47,4, high risk for inpatient complications.   5. Iron deficiency anemia. Hgb stable, follow iron stores as outpatient.   Status is: Inpatient  Remains inpatient appropriate because:IV treatments appropriate due to intensity of illness or inability to take PO   Dispo: The patient is from: Home              Anticipated d/c is to: Home              Anticipated d/c date is: 1 day              Patient currently is not medically stable to d/c. Possible dc home in the next 24 to 48 H,     DVT prophylaxis: Enoxaparin high dose   Code Status:   full  Family Communication:  I spoke with patient's husband at the bedside, we talked in detail about patient's condition, plan of care and prognosis and all questions were addressed.      Nutrition Status: Nutrition  Problem: Increased nutrient needs Etiology: catabolic illness (COVID 19) Signs/Symptoms: estimated needs      Subjective: Patient is feeling better, but not yet back to baseline, tolerating po well, no nausea or vomiting.   Objective: Vitals:   10/24/20 2006 10/24/20 2357 10/25/20 0522 10/25/20 0917  BP: 111/73 116/71 100/62 127/77  Pulse: 64 68 (!) 59 80  Resp: 16 16 18 20   Temp: 97.7 F (36.5 C) 97.9 F (36.6 C) 97.6 F (36.4 C) 97.7 F (36.5 C)  TempSrc: Oral Oral Oral Oral  SpO2: 98% 95% 97% 96%  Weight:      Height:        No intake or output data in the 24 hours ending 10/25/20 1215 Filed Weights   10/22/20 2331 10/23/20 1051  Weight: 106.6 kg 106.6 kg    Examination:   General: Not in pain or dyspnea, deconditioned  Neurology: Awake and alert, non focal  E ENT: mild pallor, no icterus, oral mucosa moist Cardiovascular: No JVD. S1-S2 present, rhythmic, no gallops, rubs, or murmurs. No lower extremity edema. Pulmonary: positive breath sounds bilaterally, no wheezing Gastrointestinal. Abdomen soft and non tender Skin. No rashes Musculoskeletal: no joint deformities     Data Reviewed: I have personally reviewed following labs and imaging studies  CBC: Recent Labs  Lab 10/22/20 2346 10/24/20 0500  WBC 4.8 7.0  NEUTROABS 3.1  --   HGB 12.9 12.4  HCT 40.9 39.2  MCV 82.8 83.6  PLT 234 [id number]   Basic Metabolic Panel: Recent Labs  Lab 10/22/20 2346 10/24/20 0500 10/25/20 0434  NA 140 142 142  K 2.9* 3.4* 4.4  CL 98 104 103  CO2 30 30 31   GLUCOSE 127* 145* 130*  BUN 7 13 14   CREATININE 0.83 0.76 0.79  CALCIUM 8.5* 8.4* 9.1  MG 1.7 1.9  --    GFR: Estimated Creatinine Clearance: 94.1 mL/min (by C-G formula based on SCr of 0.79 mg/dL). Liver Function Tests: Recent Labs  Lab 10/22/20 2346 10/24/20 0500 10/25/20 0434  AST 42* 35 24  ALT 25 27 23   ALKPHOS 45 39 39  BILITOT 0.9 0.6 0.6  PROT 7.1 6.7 6.4*  ALBUMIN 3.1* 2.8* 2.8*   No results for input(s): LIPASE, AMYLASE in the last 168 hours. No results for input(s): AMMONIA in the last 168 hours. Coagulation Profile: No results for input(s): INR, PROTIME in the last 168 hours. Cardiac Enzymes: No results for input(s): CKTOTAL, CKMB, CKMBINDEX, TROPONINI in the last 168 hours. BNP (last 3 results) No results for input(s): PROBNP in the last 8760 hours. HbA1C: No results for input(s): HGBA1C in the last 72 hours. CBG: No results for input(s): GLUCAP in the last 168 hours. Lipid Profile: No results for input(s): CHOL, HDL,  LDLCALC, TRIG, CHOLHDL, LDLDIRECT in the last 72 hours. Thyroid Function Tests: No results for input(s): TSH, T4TOTAL, FREET4, T3FREE, THYROIDAB in the last 72 hours. Anemia Panel: Recent Labs    10/24/20 0500 10/25/20 0434  FERRITIN 263 43      Radiology Studies: I have reviewed all of the imaging during this hospital visit personally     Scheduled Meds: . vitamin C  500 mg Oral Daily  . chlorpheniramine-HYDROcodone  5 mL Oral Q12H  . enoxaparin (LOVENOX) injection  0.5 mg/kg Subcutaneous Q24H  . feeding supplement  237 mL Oral TID BM  . Ipratropium-Albuterol  1 puff Inhalation QID  . levothyroxine  112 mcg Oral Q0600  . mouth rinse  15 mL Mouth Rinse  BID  . methylPREDNISolone (SOLU-MEDROL) injection  60 mg Intravenous Q12H  . multivitamin with minerals  1 tablet Oral Daily  . zinc sulfate  220 mg Oral Daily   Continuous Infusions: . remdesivir 100 mg in NS 100 mL 100 mg (10/25/20 1007)     LOS: 2 days        Shayne Diguglielmo Gerome Apley, MD

## 2020-10-26 DIAGNOSIS — J9601 Acute respiratory failure with hypoxia: Secondary | ICD-10-CM | POA: Diagnosis not present

## 2020-10-26 DIAGNOSIS — E669 Obesity, unspecified: Secondary | ICD-10-CM

## 2020-10-26 DIAGNOSIS — U071 COVID-19: Secondary | ICD-10-CM | POA: Diagnosis not present

## 2020-10-26 DIAGNOSIS — K219 Gastro-esophageal reflux disease without esophagitis: Secondary | ICD-10-CM | POA: Diagnosis not present

## 2020-10-26 LAB — COMPREHENSIVE METABOLIC PANEL
ALT: 22 U/L (ref 0–44)
AST: 24 U/L (ref 15–41)
Albumin: 2.6 g/dL — ABNORMAL LOW (ref 3.5–5.0)
Alkaline Phosphatase: 39 U/L (ref 38–126)
Anion gap: 9 (ref 5–15)
BUN: 15 mg/dL (ref 6–20)
CO2: 31 mmol/L (ref 22–32)
Calcium: 8.6 mg/dL — ABNORMAL LOW (ref 8.9–10.3)
Chloride: 102 mmol/L (ref 98–111)
Creatinine, Ser: 0.62 mg/dL (ref 0.44–1.00)
GFR, Estimated: >60 mL/min (ref >60)
Glucose, Bld: 83 mg/dL (ref 70–99)
Potassium: 3.9 mmol/L (ref 3.5–5.1)
Sodium: 142 mmol/L (ref 135–145)
Total Bilirubin: 0.7 mg/dL (ref 0.3–1.2)
Total Protein: 5.7 g/dL — ABNORMAL LOW (ref 6.5–8.1)

## 2020-10-26 LAB — C-REACTIVE PROTEIN: CRP: 0.6 mg/dL (ref <1.0)

## 2020-10-26 LAB — FIBRIN DERIVATIVES D-DIMER (ARMC ONLY): Fibrin derivatives D-dimer (ARMC): 537.54 ng/mL (FEU) — ABNORMAL HIGH (ref 0.00–499.00)

## 2020-10-26 LAB — FERRITIN: Ferritin: 232 ng/mL (ref 11–307)

## 2020-10-26 MED ORDER — ALBUTEROL SULFATE HFA 108 (90 BASE) MCG/ACT IN AERS: 2.0000 | INHALATION_SPRAY | RESPIRATORY_TRACT | 0 refills | Status: DC | PRN

## 2020-10-26 MED ORDER — GUAIFENESIN-DM 100-10 MG/5ML PO SYRP: 10.0000 mL | ORAL_SOLUTION | Freq: Four times a day (QID) | ORAL | 0 refills | Status: DC | PRN

## 2020-10-26 MED ORDER — ENSURE ENLIVE PO LIQD: 237.0000 mL | Freq: Three times a day (TID) | ORAL | 0 refills | Status: AC

## 2020-10-26 NOTE — Discharge Summary (Signed)
Physician Discharge Summary  Sue Chan [id number] DOB: 1971/09/12 DOA: 10/22/2020  PCP: Sue Aquas, MD  Admit date: 10/22/2020 Discharge date: 10/26/2020  Admitted From: Home  Disposition:  Home   Recommendations for Outpatient Follow-up and new medication changes:  1. Follow up with Dr. Algie Coffer in 2 weeks.  2. Continue self quarantine for 2 weeks, maintain physical distancing and use a mask in public.   Home Health: no   Equipment/Devices: no    Discharge Condition: stable  CODE STATUS: full  Diet recommendation: regular  Brief/Interim Summary: Sue Chan was admitted to the hospital with working diagnosis of acute hypoxic respiratory failure due to SARS COVID-19 viral pneumonia.  50 year old female with past medical history for hypothyroidism and iron deficiency anemia.She was diagnosed with SARS COVID-19 viral infection 12/27, at [facility] ER. Her symptoms were consistent with 2 weeks of generalized weakness, poor appetite, dry cough and dyspnea. Patient was discharged home with supportive care. Unfortunately her symptoms worsen and she came back to the hospital. On his evaluation her oximetry was 80% on room air, heart rate 79, blood pressure 89/67, respiratory rate 25, oxygen saturation 98% on supplemental oxygen, her lungs had no wheezing or rales, heart S1-S2, present, rhythmic, soft abdomen no lower extremity edema.  NA 140, K 2,9, Cl 98, bicarbonate 30, glucose 127, cr 0,83, Mg 1,7, white count 4.8, hemoglobin 12.9, hematocrit 40.9, platelets 234.  Chest radiograph with bilateral interstitial infiltrates, predominantly left lower lobe, right upper lobe and right lower lobe. CT chest with peripheral groundglass opacities bilaterally, no pulmonary embolism. EKG 102 bpm, normal axis, normal intervals, sinus rhythm, no ST segment or T wave changes.   1.  Acute hypoxic respiratory failure due to SARS COVID-19 viral pneumonia. Patient received medical  therapy with systemic corticosteroids and remdesivir.  Bronchodilators, antitussive agents and airway clearing techniques.  Her symptoms, inflammatory markers and oxygenation improved.  COVID-19 Labs  Recent Labs    10/24/20 0500 10/24/20 0616 10/25/20 0434 10/26/20 0557  FERRITIN 263  --  246 232  CRP  --  2.8* 1.0* 0.6    No results found for: SARSCOV2NAA  At discharge her oximetry is 95 to 97% on room air on ambulation.  2.  Hypothyroid.  Continue levothyroxine.  3.  Hypokalemia.  Patient received potassium chloride with correction of serum potassium, her p.o. intake has normalized. Her discharge sodium is 142 9 potassium 3.9, chloride 102, bicarb 31, glucose 83, BUN 15, creatinine 0.62.  4.  Obesity class III.  Calculated BMI 47.4.  Follow-up as an outpatient.  5.  Chronic iron deficiency anemia.  Her hemoglobin remained stable, continue oral iron supplements.   Discharge Diagnoses:  Principal Problem:   Pneumonia due to COVID-19 virus Active Problems:   Hypothyroidism   GERD (gastroesophageal reflux disease)   Acute respiratory failure with hypoxia (HCC)   Iron deficiency anemia   Class 3 obesity    Discharge Instructions  Discharge Instructions    Diet - low sodium heart healthy   Complete by: As directed    Discharge instructions   Complete by: As directed    Please continue self quarantine for 2 weeks, maintain physical distancing and use a mask in public.   Increase activity slowly   Complete by: As directed    MyChart COVID-19 home monitoring program   Complete by: Oct 26, 2020    Is the patient willing to use the [place] for home monitoring?: Yes   Temperature monitoring   Complete  by: Oct 26, 2020    After how many days would you like to receive a notification of this patient's flowsheet entries?: 1     Allergies as of 10/26/2020   No Known Allergies     Medication List    TAKE these medications   albuterol 108 (90 Base) MCG/ACT  inhaler Commonly known as: VENTOLIN HFA Inhale 2 puffs into the lungs every 4 (four) hours as needed for wheezing or shortness of breath.   feeding supplement Liqd Take 237 mLs by mouth 3 (three) times daily between meals.   guaiFENesin-dextromethorphan 100-10 MG/5ML syrup Commonly known as: ROBITUSSIN DM Take 10 mLs by mouth every 6 (six) hours as needed for cough.   levothyroxine 112 MCG tablet Commonly known as: SYNTHROID Take 112 mcg by mouth daily before breakfast.   methocarbamol 500 MG tablet Commonly known as: ROBAXIN Take 500 mg by mouth 2 (two) times daily.   triamcinolone ointment 0.1 % Commonly known as: KENALOG Apply 1 application topically 4 (four) times daily.       No Known Allergies    Procedures/Studies: CT Angio Chest PE W and/or Wo Contrast  Result Date: 10/23/2020 CLINICAL DATA:  COVID-19.  Cough. EXAM: CT ANGIOGRAPHY CHEST WITH CONTRAST TECHNIQUE: Multidetector CT imaging of the chest was performed using the standard protocol during bolus administration of intravenous contrast. Multiplanar CT image reconstructions and MIPs were obtained to evaluate the vascular anatomy. CONTRAST:  18mL OMNIPAQUE IOHEXOL 350 MG/ML SOLN COMPARISON:  None. FINDINGS: Cardiovascular: Contrast injection is sufficient to demonstrate satisfactory opacification of the pulmonary arteries to the segmental level. There is no pulmonary embolus or evidence of right heart strain. The size of the main pulmonary artery is normal. Heart size is normal, with no pericardial effusion. The course and caliber of the aorta are normal. There is no atherosclerotic calcification. Opacification decreased due to pulmonary arterial phase contrast bolus timing. Mediastinum/Nodes: No mediastinal, hilar or axillary lymphadenopathy. Normal visualized thyroid. Thoracic esophageal course is normal. Lungs/Pleura: Multifocal, peripheral predominant ground glass opacities. No pleural effusion. Upper Abdomen:  Contrast bolus timing is not optimized for evaluation of the abdominal organs. The visualized portions of the organs of the upper abdomen are normal. Musculoskeletal: No chest wall abnormality. No bony spinal canal stenosis. Review of the MIP images confirms the above findings. IMPRESSION: 1. No pulmonary embolus or acute aortic syndrome. 2. COVID-19 pattern pneumonia. Electronically Signed   By: Ulyses Jarred M.D.   On: 10/23/2020 01:12   DG Chest Portable 1 View  Result Date: 10/23/2020 CLINICAL DATA:  Shortness of breath and COVID EXAM: PORTABLE CHEST 1 VIEW COMPARISON:  None. FINDINGS: The heart size and mediastinal contours are within normal limits. There are shallow degree of aeration. Patchy airspace opacities are seen within both lungs predominantly within the lung bases, left greater than right. No pleural effusion. IMPRESSION: Multifocal airspace opacities, consistent with multifocal pneumonia. Electronically Signed   By: Prudencio Pair M.D.   On: 10/23/2020 00:32     Subjective: Patient is out of bed, no nausea or vomiting, dyspnea continue to improve, no chest pain,   Discharge Exam: Vitals:   10/26/20 0803 10/26/20 1143  BP: 101/69 111/72  Pulse: (!) 56 81  Resp: 19 18  Temp: 98 F (36.7 C) 98 F (36.7 C)  SpO2: 99% 97%   Vitals:   10/25/20 2355 10/26/20 0519 10/26/20 0803 10/26/20 1143  BP: 114/68 99/70 101/69 111/72  Pulse: 63 (!) 58 (!) 56 81  Resp: 18 18  19 18  Temp: 97.9 F (36.6 C) 97.9 F (36.6 C) 98 F (36.7 C) 98 F (36.7 C)  TempSrc:   Oral Oral  SpO2: 99% 97% 99% 97%  Weight:      Height:        General: Not in pain or dyspnea, Neurology: Awake and alert, non focal  E ENT: no pallor, no icterus, oral mucosa moist Cardiovascular: No JVD. S1-S2 present, rhythmic, no gallops, rubs, or murmurs. No lower extremity edema. Pulmonary: positive breath sounds bilaterally, no wheezing Gastrointestinal. Abdomen soft and non tender Skin. No  rashes Musculoskeletal: no joint deformities   The results of significant diagnostics from this hospitalization (including imaging, microbiology, ancillary and laboratory) are listed below for reference.     Microbiology: No results found for this or any previous visit (from the past 240 hour(s)).   Labs: BNP (last 3 results) No results for input(s): BNP in the last 8760 hours. Basic Metabolic Panel: Recent Labs  Lab 10/22/20 2346 10/24/20 0500 10/25/20 0434 10/26/20 0557  NA 140 142 142 142  K 2.9* 3.4* 4.4 3.9  CL 98 104 103 102  CO2 30 30 31 31   GLUCOSE 127* 145* 130* 83  BUN 7 13 14 15   CREATININE 0.83 0.76 0.79 0.62  CALCIUM 8.5* 8.4* 9.1 8.6*  MG 1.7 1.9  --   --    Liver Function Tests: Recent Labs  Lab 10/22/20 2346 10/24/20 0500 10/25/20 0434 10/26/20 0557  AST 42* 35 24 24  ALT 25 27 23 22   ALKPHOS 45 39 39 39  BILITOT 0.9 0.6 0.6 0.7  PROT 7.1 6.7 6.4* 5.7*  ALBUMIN 3.1* 2.8* 2.8* 2.6*   No results for input(s): LIPASE, AMYLASE in the last 168 hours. No results for input(s): AMMONIA in the last 168 hours. CBC: Recent Labs  Lab 10/22/20 2346 10/24/20 0500  WBC 4.8 7.0  NEUTROABS 3.1  --   HGB 12.9 12.4  HCT 40.9 39.2  MCV 82.8 83.6  PLT 234 298   Cardiac Enzymes: No results for input(s): CKTOTAL, CKMB, CKMBINDEX, TROPONINI in the last 168 hours. BNP: Invalid input(s): POCBNP CBG: No results for input(s): GLUCAP in the last 168 hours. D-Dimer No results for input(s): DDIMER in the last 72 hours. Hgb A1c No results for input(s): HGBA1C in the last 72 hours. Lipid Profile No results for input(s): CHOL, HDL, LDLCALC, TRIG, CHOLHDL, LDLDIRECT in the last 72 hours. Thyroid function studies No results for input(s): TSH, T4TOTAL, T3FREE, THYROIDAB in the last 72 hours.  Invalid input(s): FREET3 Anemia work up Recent Labs    10/25/20 0434 10/26/20 0557  FERRITIN 246 232   Urinalysis No results found for: COLORURINE, APPEARANCEUR,  LABSPEC, PHURINE, GLUCOSEU, HGBUR, BILIRUBINUR, KETONESUR, PROTEINUR, UROBILINOGEN, NITRITE, LEUKOCYTESUR Sepsis Labs Invalid input(s): PROCALCITONIN,  WBC,  LACTICIDVEN Microbiology No results found for this or any previous visit (from the past 240 hour(s)).   Time coordinating discharge: 45 minutes  SIGNED:   10/26/20, MD  [facility] 10/26/2020, 2:50 PM

## 2021-02-10 DIAGNOSIS — F419 Anxiety disorder, unspecified: Secondary | ICD-10-CM | POA: Insufficient documentation

## 2021-02-11 ENCOUNTER — Ambulatory Visit (INDEPENDENT_AMBULATORY_CARE_PROVIDER_SITE_OTHER): Payer: BC Managed Care – PPO

## 2021-02-11 ENCOUNTER — Other Ambulatory Visit: Payer: Self-pay

## 2021-02-11 ENCOUNTER — Encounter: Payer: Self-pay | Admitting: Podiatry

## 2021-02-11 ENCOUNTER — Ambulatory Visit: Payer: BC Managed Care – PPO | Admitting: Podiatry

## 2021-02-11 DIAGNOSIS — M2011 Hallux valgus (acquired), right foot: Secondary | ICD-10-CM | POA: Diagnosis not present

## 2021-02-11 DIAGNOSIS — M79672 Pain in left foot: Secondary | ICD-10-CM

## 2021-02-11 DIAGNOSIS — M205X9 Other deformities of toe(s) (acquired), unspecified foot: Secondary | ICD-10-CM | POA: Diagnosis not present

## 2021-02-11 DIAGNOSIS — M2010 Hallux valgus (acquired), unspecified foot: Secondary | ICD-10-CM

## 2021-02-11 DIAGNOSIS — L603 Nail dystrophy: Secondary | ICD-10-CM

## 2021-02-11 DIAGNOSIS — M21619 Bunion of unspecified foot: Secondary | ICD-10-CM

## 2021-02-11 NOTE — Progress Notes (Signed)
  Subjective:  Patient ID: Sue Chan, female    DOB: Aug 31, 1971,  MRN: [id number]  Chief Complaint  Patient presents with  . Bunions  . Callouses    Patient presents today for bunions bilat hallux.  She says right gets painful at times.  She also has painful corns tops of toes bilat and wants to discuss nail fungus    50 y.o. female presents with the above complaint. History confirmed with patient.   Objective:  Physical Exam: warm, good capillary refill, no trophic changes or ulcerative lesions, normal DP and PT pulses and normal sensory exam. moderate hallux valgus with hypermobility of the first ray bilaterally. Dorsal DIPJ corns L3,5 and R3,4,5 with mallet toe deformity. Discoloration and onycholysis of left hallux nail   Radiographs: X-ray of both feet: moderate to severe R>L hallux valgus, mallet toe deformity Assessment:   1. Hallux valgus with bunions, unspecified laterality   2. Nail dystrophy      Plan:  Patient was evaluated and treated and all questions answered.  Nail plate L hallux biopsied to see if this is nail fungus. Will d/w her when we get results.   Discussed the etiology and treatment including surgical and non surgical treatment for painful bunions and hammertoes. She  has exhausted all non surgical treatment prior to this visit including shoe gear changes and padding. She desires surgical intervention. We discussed all risks including but not limited to: pain, swelling, infection, scar, numbness which may be temporary or permanent, chronic pain, stiffness, nerve pain or damage, wound healing problems, bone healing problems including delayed or non-union and recurrence. Specifically we discussed the following procedures: Lapidus and Akin bunion correction with autograft from right heel and mallet toe correction with corn excision 3,4,5 on right. Informed consent was signed today. Surgery will be scheduled at a mutually agreeable date. Information  regarding this will be forwarded to our surgery scheduler.   Surgical plan:  Procedure: -R lapidus/Akin, calc autograft, mallet toe 3-5 w/ corn excision  Location: -[facility]  Anesthesia plan: -IV sedation w/ regional block  Postoperative pain plan: - Tylenol 1000 mg every 6 hours, ibuprofen 600 mg every 6 hours, gabapentin 300 mg every 8 hours x5 days, oxycodone 5 mg 1-2 tabs every 6 hours only as needed  DVT prophylaxis: -none  WB Restrictions / DME needs: -NWB in CAM boot    No follow-ups on file.

## 2021-02-19 ENCOUNTER — Encounter: Payer: Self-pay | Admitting: *Deleted

## 2021-04-01 ENCOUNTER — Telehealth: Payer: Self-pay | Admitting: Urology

## 2021-04-01 NOTE — Telephone Encounter (Signed)
DOS - 04/24/21  Barbie Banner OSTEOTOMY RIGHT --- [id number] LAPIDUS PROC INCLUDING BUNIONECTOMY RIGHT --- [phone] HAMMERTOE REPAIR 3-5 RIGHT --- [id number]   [employer] EFFECTIVE DATE - 01/23/21   PLAN DEDUCTIBLE - $1,500.00 W/ $1,148.53 REMAINING OUT OF POCKET - $5,000.00 W/ $4,542.53 REMAINING COINSURANCE - 30% COPAY - $0.00    NO PRIOR AUTH REQUIRED

## 2021-04-21 ENCOUNTER — Other Ambulatory Visit: Payer: Self-pay | Admitting: Podiatry

## 2021-04-21 DIAGNOSIS — R2689 Other abnormalities of gait and mobility: Secondary | ICD-10-CM

## 2021-04-24 ENCOUNTER — Ambulatory Visit (HOSPITAL_BASED_OUTPATIENT_CLINIC_OR_DEPARTMENT_OTHER): Admission: RE | Admit: 2021-04-24 | Payer: BC Managed Care – PPO | Source: Home / Self Care | Admitting: Podiatry

## 2021-04-24 ENCOUNTER — Encounter (HOSPITAL_BASED_OUTPATIENT_CLINIC_OR_DEPARTMENT_OTHER): Admission: RE | Payer: Self-pay | Source: Home / Self Care

## 2021-04-24 SURGERY — CORRECTION, HAMMER TOE
Anesthesia: Choice | Site: Toe | Laterality: Right

## 2021-04-29 ENCOUNTER — Encounter: Payer: BC Managed Care – PPO | Admitting: Podiatry

## 2021-05-13 ENCOUNTER — Encounter: Payer: BC Managed Care – PPO | Admitting: Podiatry

## 2021-06-03 ENCOUNTER — Encounter: Payer: BC Managed Care – PPO | Admitting: Podiatry

## 2021-10-14 ENCOUNTER — Other Ambulatory Visit: Payer: Self-pay | Admitting: Obstetrics and Gynecology

## 2021-10-14 DIAGNOSIS — Z1231 Encounter for screening mammogram for malignant neoplasm of breast: Secondary | ICD-10-CM

## 2021-12-04 ENCOUNTER — Other Ambulatory Visit: Payer: Self-pay

## 2021-12-04 ENCOUNTER — Ambulatory Visit
Admission: RE | Admit: 2021-12-04 | Discharge: 2021-12-04 | Disposition: A | Discharge status: Home / Self Care | Payer: BC Managed Care – PPO | Source: Ambulatory Visit | Attending: Obstetrics and Gynecology | Admitting: Obstetrics and Gynecology

## 2021-12-04 DIAGNOSIS — Z1231 Encounter for screening mammogram for malignant neoplasm of breast: Secondary | ICD-10-CM | POA: Diagnosis not present

## 2021-12-10 ENCOUNTER — Other Ambulatory Visit: Payer: Self-pay | Admitting: Obstetrics and Gynecology

## 2021-12-10 DIAGNOSIS — R928 Other abnormal and inconclusive findings on diagnostic imaging of breast: Secondary | ICD-10-CM

## 2021-12-10 DIAGNOSIS — N6489 Other specified disorders of breast: Secondary | ICD-10-CM

## 2021-12-10 DIAGNOSIS — N6310 Unspecified lump in the right breast, unspecified quadrant: Secondary | ICD-10-CM

## 2021-12-16 ENCOUNTER — Ambulatory Visit
Admission: RE | Admit: 2021-12-16 | Discharge: 2021-12-16 | Disposition: A | Discharge status: Home / Self Care | Payer: BC Managed Care – PPO | Source: Ambulatory Visit | Attending: Obstetrics and Gynecology | Admitting: Obstetrics and Gynecology

## 2021-12-16 ENCOUNTER — Other Ambulatory Visit: Payer: Self-pay

## 2021-12-16 DIAGNOSIS — N6310 Unspecified lump in the right breast, unspecified quadrant: Secondary | ICD-10-CM

## 2021-12-16 DIAGNOSIS — R928 Other abnormal and inconclusive findings on diagnostic imaging of breast: Secondary | ICD-10-CM | POA: Diagnosis present

## 2021-12-16 DIAGNOSIS — N6489 Other specified disorders of breast: Secondary | ICD-10-CM

## 2021-12-24 ENCOUNTER — Other Ambulatory Visit: Payer: Self-pay | Admitting: Obstetrics and Gynecology

## 2021-12-24 DIAGNOSIS — R928 Other abnormal and inconclusive findings on diagnostic imaging of breast: Secondary | ICD-10-CM

## 2021-12-24 DIAGNOSIS — R921 Mammographic calcification found on diagnostic imaging of breast: Secondary | ICD-10-CM

## 2022-01-08 ENCOUNTER — Ambulatory Visit
Admission: RE | Admit: 2022-01-08 | Discharge: 2022-01-08 | Disposition: A | Discharge status: Home / Self Care | Payer: BC Managed Care – PPO | Source: Ambulatory Visit | Attending: Obstetrics and Gynecology | Admitting: Obstetrics and Gynecology

## 2022-01-08 ENCOUNTER — Other Ambulatory Visit: Payer: Self-pay

## 2022-01-08 DIAGNOSIS — R928 Other abnormal and inconclusive findings on diagnostic imaging of breast: Secondary | ICD-10-CM | POA: Diagnosis present

## 2022-01-08 DIAGNOSIS — R921 Mammographic calcification found on diagnostic imaging of breast: Secondary | ICD-10-CM

## 2022-01-08 HISTORY — PX: BREAST BIOPSY: SHX20

## 2022-01-11 LAB — SURGICAL PATHOLOGY

## 2022-04-21 IMAGING — MG DIGITAL SCREENING BILAT W/ TOMO W/ CAD
6 of 12 series · 6 of 36 positions shown · non-contrast
Comparison: Previous exam(s).

CLINICAL DATA: Screening.

EXAM:
DIGITAL SCREENING BILATERAL MAMMOGRAM WITH TOMO AND CAD

[R MLO synth-2D]
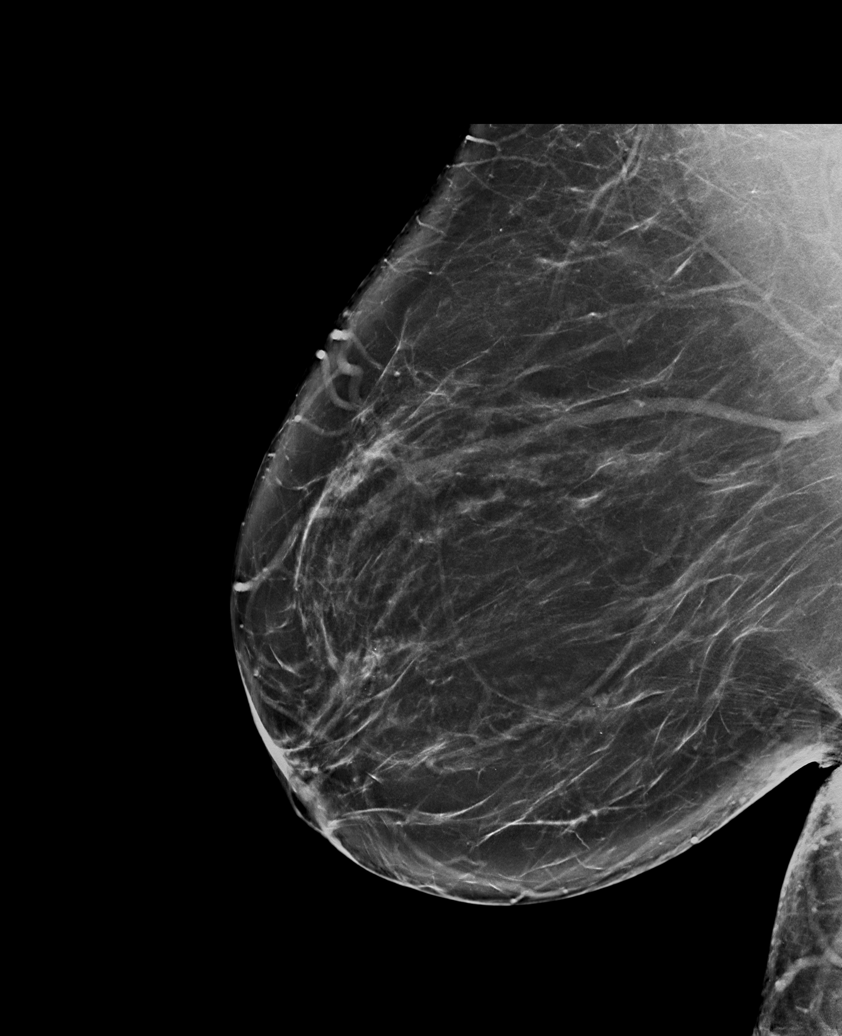

[L MLO synth-2D (1 of 2)]
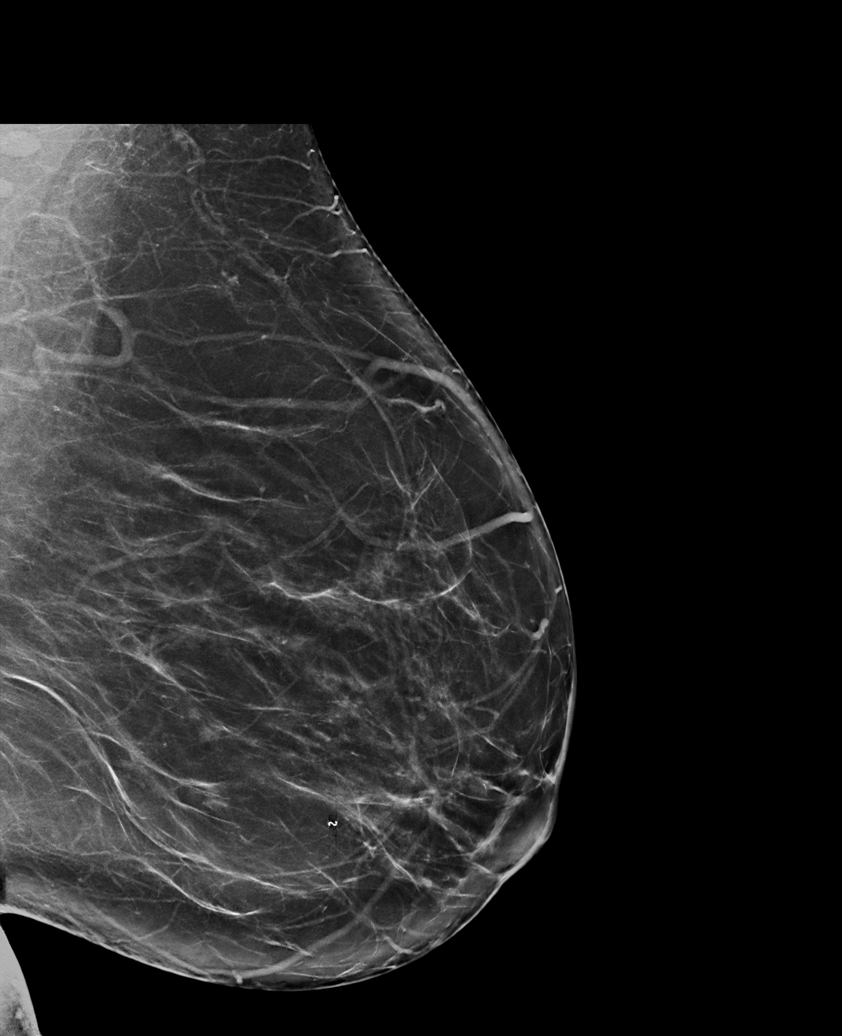

[L CC synth-2D (1 of 2)]
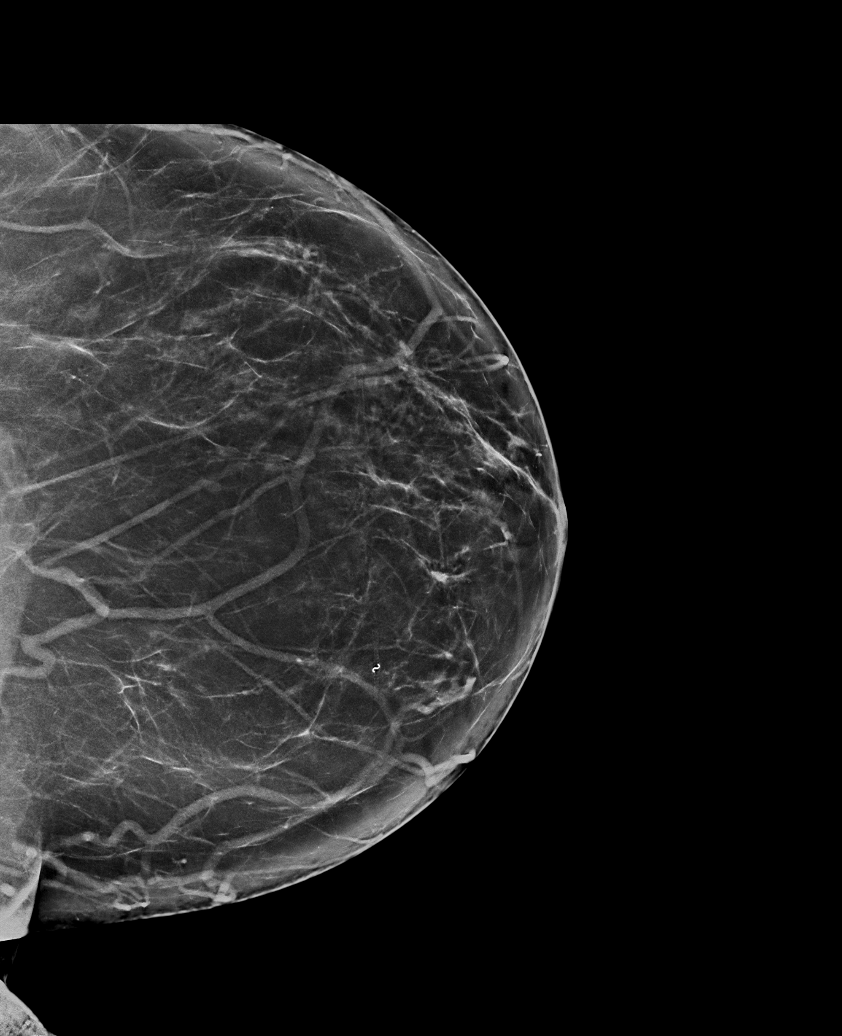

[L MLO synth-2D (2 of 2)]
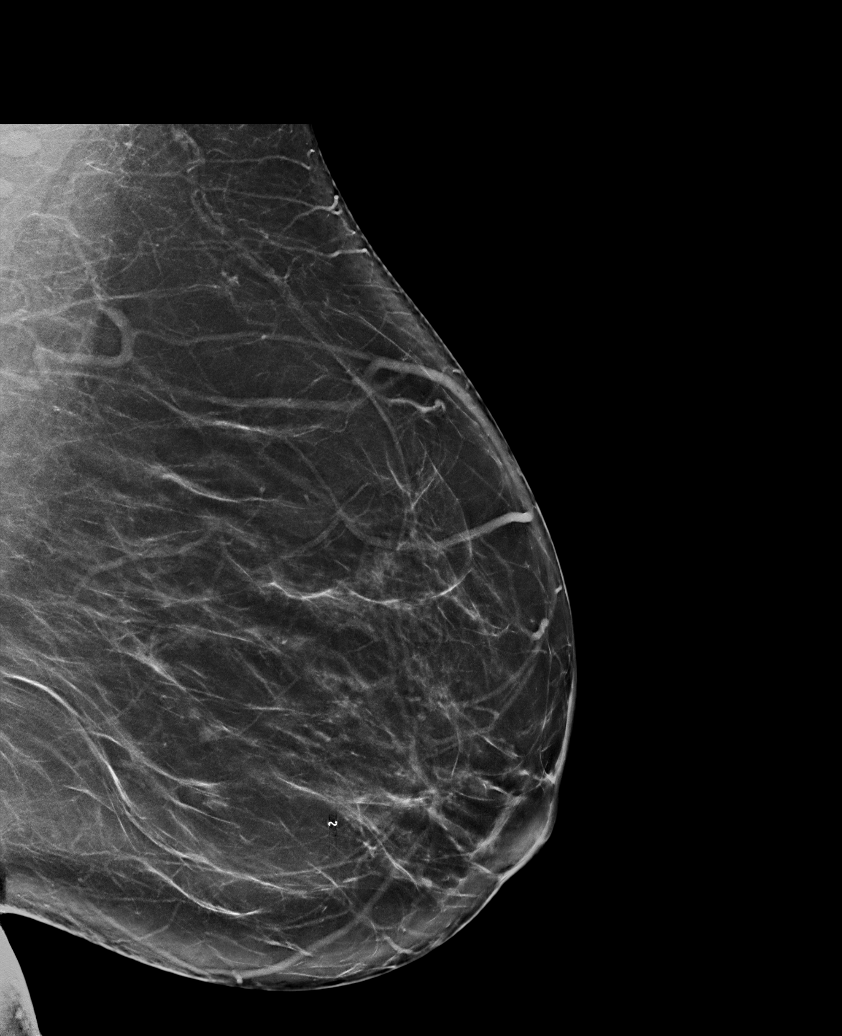

[R CC synth-2D]
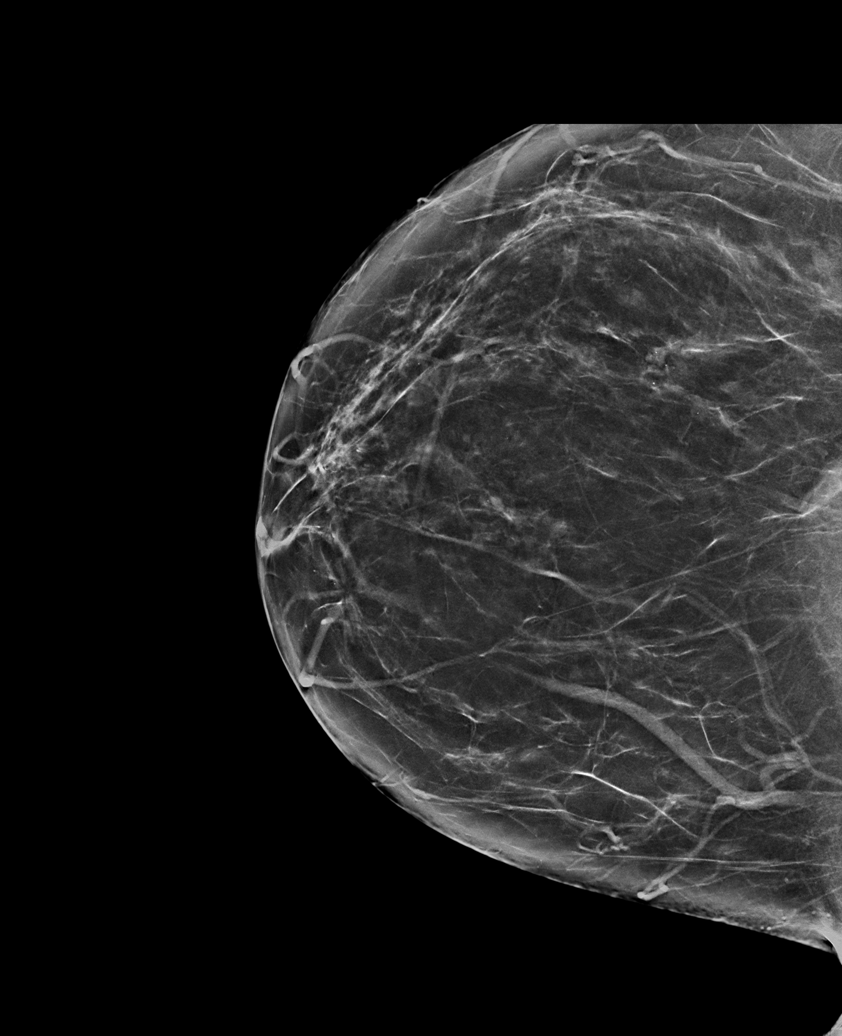

[L CC synth-2D (2 of 2)]
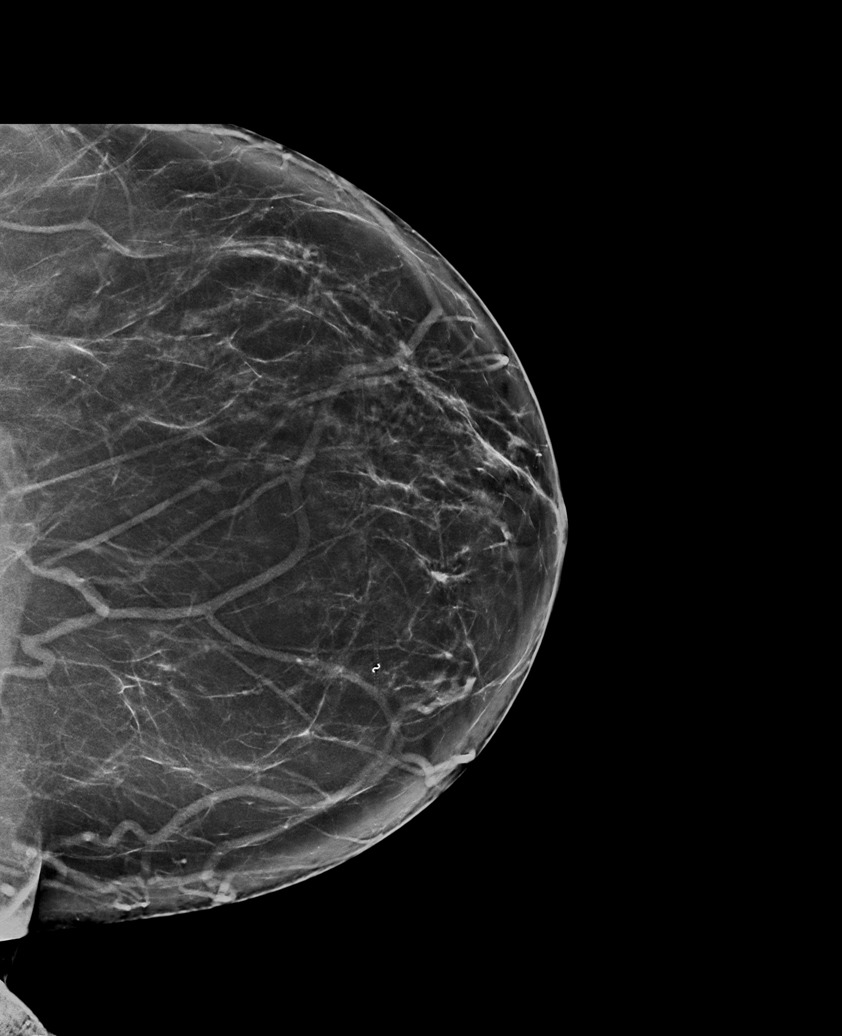

[6 of 36 positions shown; findings below may reference images not displayed]

ACR Breast Density Category b: There are scattered areas of
fibroglandular density.
FINDINGS: There are no findings suspicious for malignancy. Images were
processed with CAD.
IMPRESSION: No mammographic evidence of malignancy. A result letter of this
screening mammogram will be mailed directly to the patient.

RECOMMENDATION:
Screening mammogram in one year. (Code:[id number])

BI-RADS CATEGORY  1: Negative.

## 2022-09-17 ENCOUNTER — Encounter: Payer: Self-pay | Admitting: Emergency Medicine

## 2022-09-17 ENCOUNTER — Ambulatory Visit
Admission: EM | Admit: 2022-09-17 | Discharge: 2022-09-17 | Disposition: A | Discharge status: Home / Self Care | Payer: BC Managed Care – PPO | Attending: Internal Medicine | Admitting: Internal Medicine

## 2022-09-17 ENCOUNTER — Ambulatory Visit (INDEPENDENT_AMBULATORY_CARE_PROVIDER_SITE_OTHER): Payer: BC Managed Care – PPO

## 2022-09-17 DIAGNOSIS — R109 Unspecified abdominal pain: Secondary | ICD-10-CM | POA: Diagnosis not present

## 2022-09-17 DIAGNOSIS — E160 Drug-induced hypoglycemia without coma: Secondary | ICD-10-CM | POA: Diagnosis not present

## 2022-09-17 DIAGNOSIS — N3000 Acute cystitis without hematuria: Secondary | ICD-10-CM

## 2022-09-17 LAB — URINALYSIS, ROUTINE W REFLEX MICROSCOPIC
Glucose, UA: NEGATIVE mg/dL
Ketones, ur: 15 mg/dL — AB
Leukocytes,Ua: NEGATIVE
Nitrite: NEGATIVE
Protein, ur: 30 mg/dL — AB
Specific Gravity, Urine: 1.02 (ref 1.005–1.030)
pH: 7 (ref 5.0–8.0)

## 2022-09-17 LAB — CBC
HCT: 40.2 % (ref 36.0–46.0)
Hemoglobin: 12.9 g/dL (ref 12.0–15.0)
MCH: 26.8 pg (ref 26.0–34.0)
MCHC: 32.1 g/dL (ref 30.0–36.0)
MCV: 83.6 fL (ref 80.0–100.0)
Platelets: 236 10*3/uL (ref 150–400)
RBC: 4.81 MIL/uL (ref 3.87–5.11)
RDW: 14.2 % (ref 11.5–15.5)
WBC: 8.6 10*3/uL (ref 4.0–10.5)
nRBC: 0 % (ref 0.0–0.2)

## 2022-09-17 LAB — COMPREHENSIVE METABOLIC PANEL
ALT: 22 U/L (ref 0–44)
AST: 22 U/L (ref 15–41)
Albumin: 3.8 g/dL (ref 3.5–5.0)
Alkaline Phosphatase: 60 U/L (ref 38–126)
Anion gap: 12 (ref 5–15)
BUN: 5 mg/dL — ABNORMAL LOW (ref 6–20)
CO2: 23 mmol/L (ref 22–32)
Calcium: 8.8 mg/dL — ABNORMAL LOW (ref 8.9–10.3)
Chloride: 101 mmol/L (ref 98–111)
Creatinine, Ser: 0.69 mg/dL (ref 0.44–1.00)
GFR, Estimated: >60 mL/min (ref >60)
Glucose, Bld: 95 mg/dL (ref 70–99)
Potassium: 2.9 mmol/L — ABNORMAL LOW (ref 3.5–5.1)
Sodium: 136 mmol/L (ref 135–145)
Total Bilirubin: 0.6 mg/dL (ref 0.3–1.2)
Total Protein: 7.2 g/dL (ref 6.5–8.1)

## 2022-09-17 LAB — URINALYSIS, MICROSCOPIC (REFLEX): Squamous Epithelial / HPF: >50 (ref 0–5)

## 2022-09-17 LAB — PREGNANCY, URINE: Preg Test, Ur: NEGATIVE

## 2022-09-17 MED ORDER — POTASSIUM CHLORIDE ER 10 MEQ PO TBCR: 10.0000 meq | EXTENDED_RELEASE_TABLET | Freq: Every day | ORAL | 0 refills | Status: AC

## 2022-09-17 MED ORDER — CEPHALEXIN 500 MG PO CAPS: 500.0000 mg | ORAL_CAPSULE | Freq: Two times a day (BID) | ORAL | 0 refills | Status: AC

## 2022-09-17 NOTE — ED Provider Notes (Signed)
[name]    CSN: [id number] Arrival date & time: 09/17/22  1444      History   Chief Complaint Chief Complaint  Patient presents with   Abdominal Pain    HPI Sue Chan is a 51 y.o. female with a history of reflux, fibroids presents to the ER today with complaint of RLQ abdominal pain and nausea.  She reports this started 4 days ago.  The pain is constant but can be more intense at certain times.  She describes the pain as sharp, sore and achy.  The pain does not radiate.  She denies vomiting, constipation, diarrhea or blood in her stool.  Her last BM was this morning.  She reports having a BM lessens the pain but does not make it go away.  She denies urinary urgency, frequency, dysuria, blood in her urine, vaginal irritation, vaginal itching, vaginal discharge.  She does have a history of fibroids and is scheduled to have a hysterectomy on 12/19.  She has taken Ibuprofen and MiraLAX OTC with minimal relief of symptoms.  She reports she still does have her appendix.  HPI  Past Medical History:  Diagnosis Date   Anemia    Fibroids    GERD (gastroesophageal reflux disease)    Herpes genitalis 2015   type 2 by blood test   Hypothyroidism    s/p thyroidectomy for multinodular goiter   Menorrhagia    Obesity 12/30/2017   BMI41.60 kg/m2    Patient Active Problem List   Diagnosis Date Noted   Anxiety 02/10/2021   Class 3 obesity ([place]) 10/25/2020   Iron deficiency anemia 10/24/2020   COVID-19 virus infection 10/23/2020   Acute respiratory failure with hypoxia ([place]) 10/23/2020   Elevated d-dimer 10/23/2020   Pneumonia due to COVID-19 virus 10/23/2020   Intramural, submucous, and subserous leiomyoma of uterus 05/11/2019   Fibrocystic breast changes of both breasts 03/08/2018   Hypothyroidism    Menorrhagia    GERD (gastroesophageal reflux disease)    Fibroids    Anemia    Obesity 12/30/2017   Laryngitis 08/14/2014   Subclinical hyperthyroidism  01/31/2014   SUI (stress urinary incontinence, female) 11/27/2013   Herpes genitalis 10/25/2013   Breast mass 05/01/2013    Past Surgical History:  Procedure Laterality Date   BREAST BIOPSY Left 2014   benign, clip placed   BREAST BIOPSY Right 01/08/2022   stereo bx, calcs, ""COIL" clip-path pending   [place]  2015   performed in office   THYROIDECTOMY  12/2014   multinodular goiter   TUBAL LIGATION  1996    OB History     Gravida  2   Para  2   Term  2   Preterm      AB      Living  2      SAB      IAB      Ectopic      Multiple      Live Births  2        Obstetric Comments  1st Menstrual Cycle:  13 1st Pregnancy:  20          Home Medications    Prior to Admission medications   Medication Sig Start Date End Date Taking? Authorizing Provider  cephALEXin (KEFLEX) 500 MG capsule Take 1 capsule (500 mg total) by mouth 2 (two) times daily. 09/17/22  Yes Jearld Fenton, NP  dexlansoprazole (DEXILANT) 60 MG  capsule TAKE ONE CAPSULE BY MOUTH DAILY AS DIRECTED 06/20/15  Yes [provider]  esomeprazole (NEXIUM) 20 MG capsule Take 1 capsule twice a day by oral route as directed for 30 days. 10/11/14  Yes [provider]  levothyroxine (SYNTHROID, LEVOTHROID) 112 MCG tablet Take 112 mcg by mouth daily before breakfast.   Yes [provider]  potassium chloride (KLOR-CON) 10 MEQ tablet Take 1 tablet (10 mEq total) by mouth daily. 09/17/22  Yes Jearld Fenton, NP  PROVERA 10 MG tablet 1 po hs stop for any heavy flow for 3 days 07/22/22  Yes [provider]  hydrochlorothiazide (HYDRODIURIL) 50 MG tablet Take by mouth.    [provider]  triamcinolone ointment (KENALOG) 0.1 % Apply 1 application topically 4 (four) times daily. 10/02/20   [provider]    Family History Family History  Problem Relation Age of Onset   Breast cancer Cousin 71       paternal second  cousin   Colon polyps Mother 42   Hypertension Mother    Thyroid disease Sister     Social History Social History   Tobacco Use   Smoking status: Former    Packs/day: 1.00    Years: 5.00    Total pack years: 5.00    Types: Cigarettes   Smokeless tobacco: Never  Vaping Use   Vaping Use: Never used  Substance Use Topics   Alcohol use: Yes    Comment: occsional   Drug use: No     Allergies   Patient has no known allergies.   Review of Systems Review of Systems    Constitutional: Denies fever, malaise, fatigue, headache or abrupt weight changes.  Respiratory: Denies difficulty breathing, shortness of breath, cough or sputum production.   Cardiovascular: Denies chest pain, chest tightness, palpitations or swelling in the hands or feet.  Gastrointestinal: Patient reports RLQ abdominal pain and nausea.  Denies abdominal pain, bloating, constipation, diarrhea or blood in the stool.  GU: Denies urgency, frequency, pain with urination, burning sensation, blood in urine, odor or discharge. Musculoskeletal: Denies decrease in range of motion, difficulty with gait, muscle pain or joint pain and swelling.  Skin: Denies redness, rashes, lesions or ulcercations.   No other specific complaints in a complete review of systems (except as listed in HPI above).  Physical Exam Triage Vital Signs ED Triage Vitals  Enc Vitals Group     BP 09/17/22 1530 (!) 142/87     Pulse Rate 09/17/22 1530 91     Resp 09/17/22 1530 14     Temp 09/17/22 1530 99 F (37.2 C)     Temp Source 09/17/22 1530 Oral     SpO2 09/17/22 1530 99 %     Weight 09/17/22 1526 203 lb (92.1 kg)     Height 09/17/22 1526 _0  (1.499 m)     Head Circumference --      Peak Flow --      Pain Score 09/17/22 1526 10     Pain Loc --      Pain Edu? --      Excl. in [place]? --    No data found.  Updated Vital Signs BP (!) 142/87 (BP Location: Right Arm)   Pulse 91   Temp 99 F (37.2 C) (Oral)   Resp 14   Ht _1   (1.499 m)   Wt 203 lb (92.1 kg)   LMP 08/18/2022 (Exact Date)   SpO2 99%   BMI  41.00 kg/m    Physical Exam  BP (!) 142/87 (BP Location: Right Arm)   Pulse 91   Temp 99 F (37.2 C) (Oral)   Resp 14   Ht _0  (1.499 m)   Wt 203 lb (92.1 kg)   LMP 08/18/2022 (Exact Date)   SpO2 99%   BMI 41.00 kg/m  Wt Readings from Last 3 Encounters:  09/17/22 203 lb (92.1 kg)  10/23/20 235 lb (106.6 kg)  05/11/19 225 lb (102.1 kg)    General: Appears her stated age, appears uncomfortable but in NAD. Neck:  Neck supple, trachea midline. No masses, lumps or thyromegaly present.  Cardiovascular: Normal rate and rhythm. S1,S2 noted.  No murmur, rubs or gallops noted.  Pulmonary/Chest: Normal effort and positive vesicular breath sounds. No respiratory distress. No wheezes, rales or ronchi noted.  Abdomen: Soft and tender in the suprapubic and RLQ.  Negative psoas.  Negative rebound tenderness.  Hypoactive bowel sounds. No distention or masses noted. Liver, spleen and kidneys non palpable.  No CVA tenderness noted. Musculoskeletal:  No difficulty with gait.  Neurological: Alert and oriented.     UC Treatments / Results  Labs  Labs Reviewed  COMPREHENSIVE METABOLIC PANEL - Abnormal; Notable for the following components:      Result Value   Potassium 2.9 (*)    BUN 5 (*)    Calcium 8.8 (*)    All other components within normal limits  URINALYSIS, ROUTINE W REFLEX MICROSCOPIC - Abnormal; Notable for the following components:   Color, Urine AMBER (*)    Hgb urine dipstick MODERATE (*)    Bilirubin Urine SMALL (*)    Ketones, ur 15 (*)    Protein, ur 30 (*)    All other components within normal limits  URINALYSIS, MICROSCOPIC (REFLEX) - Abnormal; Notable for the following components:   Bacteria, UA MANY (*)    All other components within normal limits  URINE CULTURE  CBC  PREGNANCY, URINE    Radiology  Imaging Orders         DG Abdomen 1 View     IMPRESSION: No plain film  findings for an acute abdominal process.  Medications Ordered in UC Meds ordered this encounter  Medications   cephALEXin (KEFLEX) 500 MG capsule    Sig: Take 1 capsule (500 mg total) by mouth 2 (two) times daily.    Dispense:  10 capsule    Refill:  0    Order Specific Question:   Supervising Provider    Answer:   Chase Picket [[id number]]   potassium chloride (KLOR-CON) 10 MEQ tablet    Sig: Take 1 tablet (10 mEq total) by mouth daily.    Dispense:  15 tablet    Refill:  0    Order Specific Question:   Supervising Provider    Answer:   Chase Picket [id number]   [place]   Initial Impression / Assessment and Plan / UC Course  I have reviewed the triage vital signs and the nursing notes.  Pertinent labs & imaging results that were available during my care of the patient were reviewed by me and considered in my medical decision making (see chart for details).   52 year old female with complaint of RLQ abdominal pain and nausea.  KUB does not show any evidence of constipation, kidney stone, free fluid in the pelvis or air concerning for perforation per my read, confirmed by radiology.  Urinalysis shows moderate blood however she is due to start  her menses tomorrow.  No evidence of leukocytes, nitrites in the urine.  Microscopic reflex does show many bacteria.  We will send urine culture.  Urine pregnancy negative.  CBC does not show any evidence of leukocytosis.  C-Met shows hypokalemia.  She is on HCTZ but is not taking a potassium supplement. Discussed findings with patient, will treat for presumed UTI with Keflex 500 mg twice daily x5 days.  We will also send in potassium chloride 10 mEq daily.  Advised her to increase potassium containing foods in her diet.  Advised her to follow-up with her for further evaluation of her low potassium.  ER precautions discussed.  Final Clinical Impressions(s) / UC Diagnoses   Final diagnoses:  Acute cystitis without hematuria  Drug-induced  hypoglycemia     Discharge Instructions      You were seen today for right lower quadrant abdominal pain.  Your labs showed mild low potassium but was otherwise unremarkable.  I am sending in a potassium supplement for you to take daily.  He will need to follow-up with your PCP regarding your potassium levels.  Your urine is concerning for a UTI.  I am putting you on antibiotics twice daily for the next 5 days.  Increase your water intake.  Please go to the ER if your pain worsens or if you develop uncontrolled vomiting or diarrhea.     ED Prescriptions     Medication Sig Dispense Auth. Provider   cephALEXin (KEFLEX) 500 MG capsule Take 1 capsule (500 mg total) by mouth 2 (two) times daily. 10 capsule Jearld Fenton, NP   potassium chloride (KLOR-CON) 10 MEQ tablet Take 1 tablet (10 mEq total) by mouth daily. 15 tablet Jearld Fenton, NP      PDMP not reviewed this encounter.   Jearld Fenton, NP 09/17/22 [phone]

## 2022-09-17 NOTE — Discharge Instructions (Signed)
You were seen today for right lower quadrant abdominal pain.  Your labs showed mild low potassium but was otherwise unremarkable.  I am sending in a potassium supplement for you to take daily.  He will need to follow-up with your PCP regarding your potassium levels.  Your urine is concerning for a UTI.  I am putting you on antibiotics twice daily for the next 5 days.  Increase your water intake.  Please go to the ER if your pain worsens or if you develop uncontrolled vomiting or diarrhea.

## 2022-09-17 NOTE — ED Triage Notes (Signed)
Patient c/o lower abdominal pain that has not gotten better since Tuesday.  Patient states that she has gotten no relief with ibuprofen or a laxative.  Patient reports some nausea.  Patient denies diarrhea or vomiting.  Patient reports some chills.

## 2022-09-19 LAB — URINE CULTURE: Culture: 30000 — AB

## 2022-10-19 ENCOUNTER — Ambulatory Visit
Admission: RE | Admit: 2022-10-19 | Discharge: 2022-10-19 | Disposition: A | Discharge status: Home / Self Care | Payer: BC Managed Care – PPO | Source: Ambulatory Visit | Attending: Internal Medicine | Admitting: Internal Medicine

## 2022-10-19 VITALS — BP 142/83 | HR 92 | Temp 99.1°F | Resp 16

## 2022-10-19 DIAGNOSIS — R059 Cough, unspecified: Secondary | ICD-10-CM | POA: Diagnosis not present

## 2022-10-19 DIAGNOSIS — U071 COVID-19: Secondary | ICD-10-CM | POA: Diagnosis not present

## 2022-10-19 MED ORDER — BENZONATATE 200 MG PO CAPS: 200.0000 mg | ORAL_CAPSULE | Freq: Three times a day (TID) | ORAL | 0 refills | Status: AC | PRN

## 2022-10-19 MED ORDER — HYDROCODONE BIT-HOMATROP MBR 5-1.5 MG/5ML PO SOLN: 5.0000 mL | Freq: Four times a day (QID) | ORAL | 0 refills | Status: AC | PRN

## 2022-10-19 NOTE — ED Provider Notes (Signed)
[name]    CSN: [id number] Arrival date & time: 10/19/22  1642      History   Chief Complaint Chief Complaint  Patient presents with   Cough    I have tested positive for COVID and I am having a difficult time with coughing and other symptoms. I do not have a fever and it has been 7 days since I tested positive. - Entered by patient    HPI Sue Chan is a 51 y.o. female cough x 7 days since she got Covid then. Had aches and fever which lasted about 3 days. She continues having post nasal drainage and cough which gets worse when she lays down. Has not tried Netie pot rinses. Would like something to help her cough.      Patient Active Problem List   Diagnosis Date Noted   Anxiety 02/10/2021   Class 3 obesity ([place]) 10/25/2020   Iron deficiency anemia 10/24/2020   COVID-19 virus infection 10/23/2020   Acute respiratory failure with hypoxia ([place]) 10/23/2020   Elevated d-dimer 10/23/2020   Pneumonia due to COVID-19 virus 10/23/2020   Intramural, submucous, and subserous leiomyoma of uterus 05/11/2019   Fibrocystic breast changes of both breasts 03/08/2018   Hypothyroidism    Menorrhagia    GERD (gastroesophageal reflux disease)    Fibroids    Anemia    Obesity 12/30/2017   Laryngitis 08/14/2014   Subclinical hyperthyroidism 01/31/2014   SUI (stress urinary incontinence, female) 11/27/2013   Herpes genitalis 10/25/2013   Breast mass 05/01/2013    Past Surgical History:  Procedure Laterality Date   ABDOMINAL HYSTERECTOMY     BREAST BIOPSY Left 2014   benign, clip placed   BREAST BIOPSY Right 01/08/2022   stereo bx, calcs, ""COIL" clip-path pending   [place]  2015   performed in office   THYROIDECTOMY  12/2014   multinodular goiter   TUBAL LIGATION  1996    OB History     Gravida  2   Para  2   Term  2   Preterm      AB      Living  2      SAB      IAB      Ectopic      Multiple       Live Births  2        Obstetric Comments  1st Menstrual Cycle:  13 1st Pregnancy:  20          Home Medications    Prior to Admission medications   Medication Sig Start Date End Date Taking? Authorizing Provider  benzonatate (TESSALON) 200 MG capsule Take 1 capsule (200 mg total) by mouth 3 (three) times daily as needed. 10/19/22  Yes Rodriguez-Southworth, Sunday Spillers, PA-C  HYDROcodone bit-homatropine (HYCODAN) 5-1.5 MG/5ML syrup Take 5 mLs by mouth every 6 (six) hours as needed for cough. 10/19/22  Yes Rodriguez-Southworth, Sunday Spillers, PA-C  cephALEXin (KEFLEX) 500 MG capsule Take 1 capsule (500 mg total) by mouth 2 (two) times daily. 09/17/22   Jearld Fenton, NP  dexlansoprazole (DEXILANT) 60 MG capsule TAKE ONE CAPSULE BY MOUTH DAILY AS DIRECTED 06/20/15   [provider]  esomeprazole (NEXIUM) 20 MG capsule Take 1 capsule twice a day by oral route as directed for 30 days. 10/11/14   [provider]  hydrochlorothiazide (HYDRODIURIL) 50 MG tablet Take by mouth.    [provider]  levothyroxine (SYNTHROID,  LEVOTHROID) 112 MCG tablet Take 112 mcg by mouth daily before breakfast.    [provider]  potassium chloride (KLOR-CON) 10 MEQ tablet Take 1 tablet (10 mEq total) by mouth daily. 09/17/22   Jearld Fenton, NP  PROVERA 10 MG tablet 1 po hs stop for any heavy flow for 3 days 07/22/22   [provider]  triamcinolone ointment (KENALOG) 0.1 % Apply 1 application topically 4 (four) times daily. 10/02/20   [provider]    Family History Family History  Problem Relation Age of Onset   Breast cancer Cousin 66       paternal second cousin   Colon polyps Mother 60   Hypertension Mother    Thyroid disease Sister     Social History Social History   Tobacco Use   Smoking status: Former    Packs/day: 1.00    Years: 5.00    Total pack years: 5.00    Types: Cigarettes   Smokeless tobacco: Never  Vaping Use   Vaping Use:  Never used  Substance Use Topics   Alcohol use: Yes    Comment: occsional   Drug use: No     Allergies   Patient has no known allergies.   Review of Systems Review of Systems  Constitutional:  Negative for fever.  HENT:  Positive for postnasal drip. Negative for congestion, ear discharge, ear pain and sore throat.   Respiratory:  Positive for cough.   Musculoskeletal:  Negative for myalgias.  Neurological:  Negative for headaches.     Physical Exam Triage Vital Signs ED Triage Vitals  Enc Vitals Group     BP 10/19/22 1707 (!) 142/83     Pulse Rate 10/19/22 1707 92     Resp 10/19/22 1707 16     Temp 10/19/22 1707 99.1 F (37.3 C)     Temp Source 10/19/22 1707 Oral     SpO2 10/19/22 1707 98 %     Weight --      Height --      Head Circumference --      Peak Flow --      Pain Score 10/19/22 1705 0     Pain Loc --      Pain Edu? --      Excl. in [place]? --    No data found.  Updated Vital Signs BP (!) 142/83 (BP Location: Left Arm)   Pulse 92   Temp 99.1 F (37.3 C) (Oral)   Resp 16   LMP 08/18/2022 (Exact Date)   SpO2 98%   Visual Acuity Right Eye Distance:   Left Eye Distance:   Bilateral Distance:    Right Eye Near:   Left Eye Near:    Bilateral Near:       Physical Exam Vitals signs and nursing note reviewed.  Constitutional:      General: She is not in acute distress.    Appearance: Normal appearance. She is not ill-appearing, toxic-appearing or diaphoretic.  HENT:     Head: Normocephalic.     Right Ear: Tympanic membrane, ear canal and external ear normal.     Left Ear: Tympanic membrane, ear canal and external ear normal.     Nose: Nose normal.     Mouth/Throat: clear    Mouth: Mucous membranes are moist.  Eyes:     General: No scleral icterus.       Right eye: No discharge.        Left eye: No  discharge.     Conjunctiva/sclera: Conjunctivae normal.  Neck:     Musculoskeletal: Neck supple. No neck rigidity.  Cardiovascular:     Rate  and Rhythm: Normal rate and regular rhythm.     Heart sounds: No murmur.  Pulmonary:     Effort: Pulmonary effort is normal.     Breath sounds: Normal breath sounds.   Musculoskeletal: Normal range of motion.  Lymphadenopathy:     Cervical: No cervical adenopathy.  Skin:    General: Skin is warm and dry.     Coloration: Skin is not jaundiced.     Findings: No rash.  Neurological:     Mental Status: She is alert and oriented to person, place, and time.     Gait: Gait normal.  Psychiatric:        Mood and Affect: Mood normal.        Behavior: Behavior normal.        Thought Content: Thought content normal.        Judgment: Judgment normal.   UC Treatments / Results  Labs (all labs ordered are listed, but only abnormal results are displayed) Labs Reviewed - No data to display  EKG   Radiology No results found.  Procedures Procedures (including critical care time)  Medications Ordered in UC Medications - No data to display  Initial Impression / Assessment and Plan / UC Course  I have reviewed the triage vital signs and the nursing notes.  Cough Recovering from Covid infections   I advised her to use her Netie Pot bid for a few days to help clean out post nasal drainage and told her to avoid doing it before bed time  I placed her on Tessalon and Hycodan as noted.   Final Clinical Impressions(s) / UC Diagnoses   Final diagnoses:  Cough, unspecified type  COVID-19 virus infection   Discharge Instructions   None    ED Prescriptions     Medication Sig Dispense Auth. Provider   benzonatate (TESSALON) 200 MG capsule Take 1 capsule (200 mg total) by mouth 3 (three) times daily as needed. 30 capsule Rodriguez-Southworth, Neeya Prigmore, PA-C   HYDROcodone bit-homatropine (HYCODAN) 5-1.5 MG/5ML syrup Take 5 mLs by mouth every 6 (six) hours as needed for cough. 120 mL Rodriguez-Southworth, Sunday Spillers, PA-C      I have reviewed the PDMP during this encounter.    Shelby Mattocks, [place] 10/19/22 1746

## 2022-10-19 NOTE — ED Triage Notes (Signed)
I have tested positive for COVID and I am having a difficult time with coughing and other symptoms. I do not have a fever and it has been 7 days since I tested positive. - Entered by patient

## 2023-03-08 ENCOUNTER — Other Ambulatory Visit: Payer: Self-pay | Admitting: Obstetrics and Gynecology

## 2023-03-08 DIAGNOSIS — Z1231 Encounter for screening mammogram for malignant neoplasm of breast: Secondary | ICD-10-CM

## 2023-03-24 ENCOUNTER — Ambulatory Visit
Admission: RE | Admit: 2023-03-24 | Discharge: 2023-03-24 | Disposition: A | Discharge status: Home / Self Care | Payer: BC Managed Care – PPO | Source: Ambulatory Visit | Attending: Obstetrics and Gynecology | Admitting: Obstetrics and Gynecology

## 2023-03-24 DIAGNOSIS — Z1231 Encounter for screening mammogram for malignant neoplasm of breast: Secondary | ICD-10-CM | POA: Insufficient documentation

## 2023-09-02 ENCOUNTER — Ambulatory Visit
Admission: EM | Admit: 2023-09-02 | Discharge: 2023-09-02 | Disposition: A | Discharge status: Home / Self Care | Payer: BC Managed Care – PPO

## 2023-09-02 ENCOUNTER — Encounter: Payer: Self-pay | Admitting: Emergency Medicine

## 2023-09-02 DIAGNOSIS — R42 Dizziness and giddiness: Secondary | ICD-10-CM

## 2023-09-02 MED ORDER — MECLIZINE HCL 25 MG PO TABS: 25.0000 mg | ORAL_TABLET | Freq: Three times a day (TID) | ORAL | 0 refills | Status: AC | PRN

## 2023-09-02 NOTE — ED Provider Notes (Signed)
[name]    CSN: [id number] Arrival date & time: 09/02/23  0836      History   Chief Complaint Chief Complaint  Patient presents with   Dizziness   Nausea    HPI Sue Chan is a 52 y.o. female.   52 year old female pt, Sue Chan, presents to urgent care for evaluation of vertigo symptoms for last 3 months or so. Pt has seasonal allergies and takes over the counter allergy med. Pt states vertigo is worse going up her ladder at home. Requesting script for symptom management.Pt states her gyn is her PCP and has seen ENT in past.   PMH of thyroid disease, GERD,anemia,hypothyroidism  The history is provided by the patient. No language interpreter was used.    Past Medical History:  Diagnosis Date   Anemia    Fibroids    GERD (gastroesophageal reflux disease)    Herpes genitalis 2015   type 2 by blood test   Hypothyroidism    s/p thyroidectomy for multinodular goiter   Menorrhagia    Obesity 12/30/2017   BMI41.60 kg/m2    Patient Active Problem List   Diagnosis Date Noted   Vertigo 09/02/2023   Anxiety 02/10/2021   Class 3 obesity 10/25/2020   Iron deficiency anemia 10/24/2020   COVID-19 virus infection 10/23/2020   Acute respiratory failure with hypoxia (HCC) 10/23/2020   Elevated d-dimer 10/23/2020   Pneumonia due to COVID-19 virus 10/23/2020   Intramural, submucous, and subserous leiomyoma of uterus 05/11/2019   Fibrocystic breast changes of both breasts 03/08/2018   Hypothyroidism    Menorrhagia    GERD (gastroesophageal reflux disease)    Fibroids    Anemia    Obesity 12/30/2017   Laryngitis 08/14/2014   Subclinical hyperthyroidism 01/31/2014   SUI (stress urinary incontinence, female) 11/27/2013   Herpes genitalis 10/25/2013   Breast mass 05/01/2013    Past Surgical History:  Procedure Laterality Date   ABDOMINAL HYSTERECTOMY     BREAST BIOPSY Left 2014   benign, clip placed   BREAST BIOPSY Right 01/08/2022    stereo bx, calcs, ""COIL" clip-CALCIUM OXALATE CRYSTALS ASSOCIATED WITH COLUMNAR CELL CHANGE,   CESAREAN SECTION  1994   NOVASURE ABLATION  2015   performed in office   THYROIDECTOMY  12/2014   multinodular goiter   TUBAL LIGATION  1996    OB History     Gravida  2   Para  2   Term  2   Preterm      AB      Living  2      SAB      IAB      Ectopic      Multiple      Live Births  2        Obstetric Comments  1st Menstrual Cycle:  13 1st Pregnancy:  20          Home Medications    Prior to Admission medications   Medication Sig Start Date End Date Taking? Authorizing Provider  fexofenadine-pseudoephedrine (ALLEGRA-D 24) 180-240 MG 24 hr tablet Take 1 tablet by mouth daily.   Yes [provider]  levothyroxine (SYNTHROID, LEVOTHROID) 112 MCG tablet Take 112 mcg by mouth daily before breakfast.   Yes [provider]  meclizine (ANTIVERT) 25 MG tablet Take 1 tablet (25 mg total) by mouth 3 (three) times daily as needed for up to 7 days for dizziness. 09/02/23 09/09/23 Yes Smitty Ackerley, Para March, NP  benzonatate (TESSALON) 200 MG capsule Take 1 capsule (200 mg total) by mouth 3 (three) times daily as needed. 10/19/22   Rodriguez-Southworth, Nettie Elm, PA-C  cephALEXin (KEFLEX) 500 MG capsule Take 1 capsule (500 mg total) by mouth 2 (two) times daily. 09/17/22   Lorre Munroe, NP  dexlansoprazole (DEXILANT) 60 MG capsule TAKE ONE CAPSULE BY MOUTH DAILY AS DIRECTED 06/20/15   [provider]  esomeprazole (NEXIUM) 20 MG capsule Take 1 capsule twice a day by oral route as directed for 30 days. 10/11/14   [provider]  hydrochlorothiazide (HYDRODIURIL) 50 MG tablet Take by mouth.    [provider]  HYDROcodone bit-homatropine (HYCODAN) 5-1.5 MG/5ML syrup Take 5 mLs by mouth every 6 (six) hours as needed for cough. 10/19/22   Rodriguez-Southworth, Nettie Elm, PA-C  phentermine (ADIPEX-P) 37.5 MG tablet Take 37.5 mg by mouth daily  before breakfast.    [provider]  potassium chloride (KLOR-CON) 10 MEQ tablet Take 1 tablet (10 mEq total) by mouth daily. 09/17/22   Lorre Munroe, NP  PROVERA 10 MG tablet 1 po hs stop for any heavy flow for 3 days 07/22/22   [provider]  triamcinolone ointment (KENALOG) 0.1 % Apply 1 application topically 4 (four) times daily. 10/02/20   [provider]    Family History Family History  Problem Relation Age of Onset   Breast cancer Cousin 73       paternal second cousin   Colon polyps Mother 52   Hypertension Mother    Thyroid disease Sister     Social History Social History   Tobacco Use   Smoking status: Former    Current packs/day: 1.00    Average packs/day: 1 pack/day for 5.0 years (5.0 ttl pk-yrs)    Types: Cigarettes   Smokeless tobacco: Never  Vaping Use   Vaping status: Never Used  Substance Use Topics   Alcohol use: Yes    Comment: occsional   Drug use: No     Allergies   Patient has no known allergies.   Review of Systems Review of Systems  Constitutional:  Negative for fever.  Gastrointestinal:  Positive for diarrhea and nausea.  Neurological:  Positive for dizziness.  All other systems reviewed and are negative.    Physical Exam Triage Vital Signs ED Triage Vitals [09/02/23 0911]  Encounter Vitals Group     BP      Systolic BP Percentile      Diastolic BP Percentile      Pulse      Resp      Temp      Temp src      SpO2      Weight 203 lb 0.7 oz (92.1 kg)     Height 4\' 11"  (1.499 m)     Head Circumference      Peak Flow      Pain Score 0     Pain Loc      Pain Education      Exclude from Growth Chart    No data found.  Updated Vital Signs BP (!) 132/92 (BP Location: Left Arm)   Pulse 82   Temp 98.2 F (36.8 C) (Oral)   Resp 14   Ht 4\' 11"  (1.499 m)   Wt 203 lb 0.7 oz (92.1 kg)   LMP 08/18/2022 (Exact Date)   SpO2 100%   BMI 41.01 kg/m   Visual Acuity Right Eye Distance:   Left Eye  Distance:  Bilateral Distance:    Right Eye Near:   Left Eye Near:    Bilateral Near:     Physical Exam Vitals and nursing note reviewed.  Constitutional:      Appearance: She is well-developed and well-groomed.  HENT:     Head: Normocephalic.     Right Ear: A middle ear effusion is present. Tympanic membrane is retracted.     Left Ear: A middle ear effusion is present. Tympanic membrane is retracted.     Nose: Congestion present.     Mouth/Throat:     Lips: Chan.     Mouth: Mucous membranes are moist.     Pharynx: Oropharynx is clear. Uvula midline.  Eyes:     General: Vision grossly intact.     Extraocular Movements: Extraocular movements intact.     Right eye: No nystagmus.     Left eye: No nystagmus.     Conjunctiva/sclera: Conjunctivae normal.     Pupils: Pupils are equal, round, and reactive to light.  Cardiovascular:     Rate and Rhythm: Normal rate and regular rhythm.     Heart sounds: Normal heart sounds.  Pulmonary:     Effort: Pulmonary effort is normal.     Breath sounds: Normal breath sounds and air entry.  Neurological:     General: No focal deficit present.     Mental Status: She is alert and oriented to person, place, and time.     GCS: GCS eye subscore is 4. GCS verbal subscore is 5. GCS motor subscore is 6.     Cranial Nerves: No cranial nerve deficit.     Sensory: No sensory deficit.     Motor: Motor function is intact.     Coordination: Coordination is intact.     Gait: Gait is intact.  Psychiatric:        Attention and Perception: Attention normal.        Mood and Affect: Mood normal.        Speech: Speech normal.        Behavior: Behavior normal. Behavior is cooperative.      UC Treatments / Results  Labs (all labs ordered are listed, but only abnormal results are displayed) Labs Reviewed - No data to display  EKG   Radiology No results found.  Procedures Procedures (including critical care time)  Medications Ordered in  UC Medications - No data to display  Initial Impression / Assessment and Plan / UC Course  I have reviewed the triage vital signs and the nursing notes.  Pertinent labs & imaging results that were available during my care of the patient were reviewed by me and considered in my medical decision making (see chart for details).    Discussed exam findings and plan of care with patient, strict go to ER precautions given.   Patient verbalized understanding to this provider.  Ddx: Vertigo,allergies, dizziness, Meniere disease Final Clinical Impressions(s) / UC Diagnoses   Final diagnoses:  Vertigo     Discharge Instructions      Take Antivert as prescribed for vertigo. Please follow up with ENT if symptoms persist-call for appt. If you have worsening symptoms go to the emergency room for further evaluation.     ED Prescriptions     Medication Sig Dispense Auth. Provider   meclizine (ANTIVERT) 25 MG tablet Take 1 tablet (25 mg total) by mouth 3 (three) times daily as needed for up to 7 days for dizziness. 21 tablet Alfreda Hammad, Para March, NP  PDMP not reviewed this encounter.   Clancy Gourd, NP 09/02/23 1024

## 2023-09-02 NOTE — ED Triage Notes (Signed)
Patient states that she has seasonal allergies.  Patient states that she takes OTC allergy medicine.  Patient states that she has vertigo for the past 3 months.  Patient states that she has started having nausea with her dizziness.

## 2023-09-02 NOTE — Discharge Instructions (Addendum)
Take Antivert as prescribed for vertigo. Please follow up with ENT if symptoms persist-call for appt. If you have worsening symptoms go to the emergency room for further evaluation.

## 2024-04-09 ENCOUNTER — Other Ambulatory Visit: Payer: Self-pay | Admitting: Obstetrics and Gynecology

## 2024-04-09 DIAGNOSIS — Z1231 Encounter for screening mammogram for malignant neoplasm of breast: Secondary | ICD-10-CM

## 2024-05-04 ENCOUNTER — Ambulatory Visit
Admission: RE | Admit: 2024-05-04 | Discharge: 2024-05-04 | Disposition: A | Discharge status: Home / Self Care | Source: Ambulatory Visit | Attending: Obstetrics and Gynecology | Admitting: Obstetrics and Gynecology

## 2024-05-04 DIAGNOSIS — Z1231 Encounter for screening mammogram for malignant neoplasm of breast: Secondary | ICD-10-CM | POA: Diagnosis present
